# Patient Record
Sex: Female | Born: 1950 | Race: White | Hispanic: No | Marital: Single | Smoking: Never smoker
Health system: Southern US, Community
[De-identification: ages and names within clinical notes are randomized; demographics above are authoritative.]

## PROBLEM LIST (undated history)

## (undated) DIAGNOSIS — I6529 Occlusion and stenosis of unspecified carotid artery: Secondary | ICD-10-CM

## (undated) DIAGNOSIS — E559 Vitamin D deficiency, unspecified: Secondary | ICD-10-CM

## (undated) DIAGNOSIS — M8580 Other specified disorders of bone density and structure, unspecified site: Secondary | ICD-10-CM

## (undated) DIAGNOSIS — Z87442 Personal history of urinary calculi: Secondary | ICD-10-CM

## (undated) HISTORY — DX: Vitamin D deficiency, unspecified: E55.9

## (undated) HISTORY — DX: Personal history of urinary calculi: Z87.442

## (undated) HISTORY — DX: Other specified disorders of bone density and structure, unspecified site: M85.80

## (undated) HISTORY — DX: Occlusion and stenosis of unspecified carotid artery: I65.29

---

## 1973-10-14 HISTORY — PX: SKIN GRAFT: SHX250

## 1999-08-30 ENCOUNTER — Other Ambulatory Visit: Admission: RE | Admit: 1999-08-30 | Discharge: 1999-08-30 | Payer: Self-pay | Admitting: Obstetrics & Gynecology

## 2003-05-06 ENCOUNTER — Other Ambulatory Visit: Admission: RE | Admit: 2003-05-06 | Discharge: 2003-05-06 | Payer: Self-pay | Admitting: Obstetrics & Gynecology

## 2004-05-16 ENCOUNTER — Emergency Department (HOSPITAL_COMMUNITY): Admission: EM | Admit: 2004-05-16 | Discharge: 2004-05-16 | Payer: Self-pay | Admitting: Family Medicine

## 2004-06-20 ENCOUNTER — Other Ambulatory Visit: Admission: RE | Admit: 2004-06-20 | Discharge: 2004-06-20 | Payer: Self-pay | Admitting: Obstetrics & Gynecology

## 2004-10-14 HISTORY — PX: COLONOSCOPY: SHX174

## 2005-07-12 ENCOUNTER — Other Ambulatory Visit: Admission: RE | Admit: 2005-07-12 | Discharge: 2005-07-12 | Payer: Self-pay | Admitting: Obstetrics & Gynecology

## 2005-10-14 DIAGNOSIS — Z87442 Personal history of urinary calculi: Secondary | ICD-10-CM

## 2005-10-14 HISTORY — DX: Personal history of urinary calculi: Z87.442

## 2013-01-11 ENCOUNTER — Encounter: Payer: Self-pay | Admitting: Family Medicine

## 2013-01-11 ENCOUNTER — Ambulatory Visit (INDEPENDENT_AMBULATORY_CARE_PROVIDER_SITE_OTHER): Payer: PRIVATE HEALTH INSURANCE | Admitting: Family Medicine

## 2013-01-11 VITALS — BP 104/59 | HR 82 | Temp 96.8°F | Ht 67.0 in | Wt 108.0 lb

## 2013-01-11 DIAGNOSIS — E559 Vitamin D deficiency, unspecified: Secondary | ICD-10-CM

## 2013-01-11 DIAGNOSIS — M949 Disorder of cartilage, unspecified: Secondary | ICD-10-CM

## 2013-01-11 DIAGNOSIS — M8580 Other specified disorders of bone density and structure, unspecified site: Secondary | ICD-10-CM

## 2013-01-11 NOTE — Progress Notes (Signed)
  Subjective:    Patient ID: Caroline Hodge, female    DOB: 06-23-1951, 62 y.o.   MRN: [id number]  HPI This patient presents for recheck of multiple medical problems. No one accompanies the patient today.  Patient Active Problem List  Diagnosis  . Osteopenia  . Unspecified vitamin D deficiency    In addition ,no specific complaints.  The allergies, current medications, past medical history, surgical history, family and social history are reviewed.  Immunizations reviewed.  Health maintenance reviewed.  The following items are outstanding: All.      Review of Systems  Constitutional: Negative.   HENT: Negative.   Eyes: Negative.   Respiratory: Negative.   Cardiovascular: Negative.   Genitourinary: Negative.   Musculoskeletal: Negative.   Neurological: Negative.   Psychiatric/Behavioral: Negative.        Objective:   Physical Exam BP 104/59  Pulse 82  Temp(Src) 96.8 F (36 C) (Oral)  Ht 5\' 7"  (1.702 m)  Wt 108 lb (48.988 kg)  BMI 16.91 kg/m2  The patient appeared well nourished and normally developed, alert and oriented to time and place. Speech, behavior and judgement appear normal. Vital signs as documented.  Head exam is unremarkable. No scleral icterus or pallor noted.  Neck is without jugular venous distension, thyromegally, or carotid bruits. Carotid upstrokes are brisk bilaterally. No cervical adenopathy. Lungs are clear anteriorly and posteriorly to auscultation. Normal respiratory effort. Cardiac exam reveals regular rate and rhythm. First and second heart sounds normal. No murmurs, rubs or gallops.  Abdominal exam reveals normal bowl sounds, no masses, no organomegaly and no aortic enlargement. No inguinal adenopathy. Extremities are nonedematous and both femoral and pedal pulses are normal. Skin without pallor or jaundice.  Warm and dry, without rash. Neurologic exam reveals normal deep tendon reflexes and normal sensation.          Assessment  & Plan:  1. Osteopenia DEXA due in November of 14  2. Unspecified vitamin D deficiency Cont vit d and calcium  3. .Zostavax needed  4.. RTC clinic 6months

## 2013-01-11 NOTE — Patient Instructions (Signed)
Mammogram needed in May Check on Zostavax

## 2013-03-23 ENCOUNTER — Encounter: Payer: Self-pay | Admitting: Family Medicine

## 2013-07-21 ENCOUNTER — Ambulatory Visit: Payer: PRIVATE HEALTH INSURANCE | Admitting: Family Medicine

## 2013-07-21 ENCOUNTER — Ambulatory Visit (INDEPENDENT_AMBULATORY_CARE_PROVIDER_SITE_OTHER): Payer: Managed Care, Other (non HMO)

## 2013-07-21 DIAGNOSIS — Z23 Encounter for immunization: Secondary | ICD-10-CM

## 2013-07-26 ENCOUNTER — Encounter: Payer: Self-pay | Admitting: Family Medicine

## 2013-07-26 ENCOUNTER — Ambulatory Visit (INDEPENDENT_AMBULATORY_CARE_PROVIDER_SITE_OTHER): Payer: Managed Care, Other (non HMO) | Admitting: Family Medicine

## 2013-07-26 VITALS — BP 108/64 | HR 79 | Temp 98.4°F | Ht 67.0 in | Wt 111.0 lb

## 2013-07-26 DIAGNOSIS — M8580 Other specified disorders of bone density and structure, unspecified site: Secondary | ICD-10-CM

## 2013-07-26 DIAGNOSIS — Z0000 Encounter for general adult medical examination without abnormal findings: Secondary | ICD-10-CM

## 2013-07-26 DIAGNOSIS — N390 Urinary tract infection, site not specified: Secondary | ICD-10-CM

## 2013-07-26 DIAGNOSIS — E559 Vitamin D deficiency, unspecified: Secondary | ICD-10-CM

## 2013-07-26 LAB — POCT UA - MICROSCOPIC ONLY
Bacteria, U Microscopic: NEGATIVE
Casts, Ur, LPF, POC: NEGATIVE
Crystals, Ur, HPF, POC: NEGATIVE
Mucus, UA: NEGATIVE
Yeast, UA: NEGATIVE

## 2013-07-26 LAB — POCT URINALYSIS DIPSTICK
Bilirubin, UA: NEGATIVE
Glucose, UA: NEGATIVE
Nitrite, UA: NEGATIVE
Protein, UA: NEGATIVE
Spec Grav, UA: 1.025
pH, UA: 6

## 2013-07-26 LAB — POCT CBC
Hemoglobin: 13.7 g/dL (ref 12.2–16.2)
MPV: 7.5 fL (ref 0–99.8)
POC Granulocyte: 3.7 (ref 2–6.9)
POC LYMPH PERCENT: 29.4 %L (ref 10–50)
RBC: 4.6 M/uL (ref 4.04–5.48)

## 2013-07-26 MED ORDER — ESTRADIOL 0.5 MG PO TABS: 0.5000 mg | ORAL_TABLET | Freq: Every day | ORAL | Status: DC

## 2013-07-26 MED ORDER — PROGESTERONE MICRONIZED 100 MG PO CAPS: 100.0000 mg | ORAL_CAPSULE | Freq: Every day | ORAL | Status: DC

## 2013-07-26 NOTE — Progress Notes (Signed)
Subjective:    Patient ID: Caroline Hodge, female    DOB: 1951-03-05, 62 y.o.   MRN: [id number]  HPI Pt here for follow up and management of chronic medical problems. Annual wellness visit today. Patient's health maintenance was reviewed and she will get a shingle shot in December and a Prevnar shot next week. She will return the FOBT in December. She will be due for a pelvic exam after mid December or January 2015. Colonoscopy will be due in 2016.     Patient Active Problem List   Diagnosis Date Noted  . Osteopenia 01/11/2013  . Unspecified vitamin D deficiency 01/11/2013   Outpatient Encounter Prescriptions as of 07/26/2013  Medication Sig Dispense Refill  . aspirin 81 MG tablet Take 81 mg by mouth daily.      . Calcium Carbonate-Vitamin D (CALCIUM-VITAMIN D) 500-200 MG-UNIT per tablet Take 1 tablet by mouth 2 (two) times daily with a meal.      . CRANBERRY EXTRACT PO Take 1 tablet by mouth. 3600 mg      . estradiol (ESTRACE) 0.5 MG tablet Take 0.5 mg by mouth daily.      . Omega-3 Fatty Acids (FISH OIL) 1000 MG CAPS Take 1 capsule by mouth.      . progesterone (PROMETRIUM) 100 MG capsule Take 100 mg by mouth daily.      . vitamin C (ASCORBIC ACID) 500 MG tablet Take 500 mg by mouth daily.       No facility-administered encounter medications on file as of 07/26/2013.    Review of Systems  Constitutional: Negative.   HENT: Negative.   Eyes: Negative.   Respiratory: Negative.   Cardiovascular: Negative.   Gastrointestinal: Negative.   Endocrine: Negative.   Genitourinary: Negative.   Musculoskeletal: Negative.   Skin: Negative.   Allergic/Immunologic: Negative.   Neurological: Negative.   Hematological: Negative.   Psychiatric/Behavioral: Negative.        Objective:   Physical Exam  Nursing note and vitals reviewed. Constitutional: She is oriented to person, place, and time. She appears well-developed and well-nourished.  HENT:  Head: Normocephalic and atraumatic.   Right Ear: External ear normal.  Left Ear: External ear normal.  Nose: Nose normal.  Mouth/Throat: Oropharynx is clear and moist. No oropharyngeal exudate.  Eyes: Conjunctivae and EOM are normal. Right eye exhibits no discharge. Left eye exhibits no discharge. No scleral icterus.  Neck: Normal range of motion. Neck supple. No JVD present. No thyromegaly present.  No bruits in the neck  Cardiovascular: Normal rate, regular rhythm, normal heart sounds and intact distal pulses.  Exam reveals no gallop and no friction rub.   No murmur heard. At 72 per minute  Pulmonary/Chest: Effort normal and breath sounds normal. No respiratory distress. She has no wheezes. She has no rales. She exhibits no tenderness.  Abdominal: Soft. Bowel sounds are normal. She exhibits no distension and no mass. There is no tenderness. There is no guarding.  Musculoskeletal: Normal range of motion. She exhibits no edema and no tenderness.  Lymphadenopathy:    She has no cervical adenopathy.  Neurological: She is alert and oriented to person, place, and time. She has normal reflexes. No cranial nerve deficit.  Skin: Skin is warm and dry.  Patient sees dermatologist regularly skin lesion still persists on her back but dermatologist says it is benign.  Psychiatric: She has a normal mood and affect. Her behavior is normal. Judgment and thought content normal.   BP 108/64  Pulse 79  Temp(Src) 98.4 F (36.9 C) (Oral)  Ht 5\' 7"  (1.702 m)  Wt 111 lb (50.349 kg)  BMI 17.38 kg/m2        Assessment & Plan:   1. Annual physical exam   2. Osteopenia   3. Unspecified vitamin D deficiency    Orders Placed This Encounter  Procedures  . DG Bone Density    Standing Status: Future     Number of Occurrences:      Standing Expiration Date: 09/25/2014    Order Specific Question:  Reason for Exam (SYMPTOM  OR DIAGNOSIS REQUIRED)    Answer:  postmenapausal    Order Specific Question:  Is the patient pregnant?    Answer:   No    Order Specific Question:  Preferred imaging location?    Answer:  Internal  . Hepatic function panel  . BMP8+EGFR  . NMR, lipoprofile  . Vit D  25 hydroxy (rtn osteoporosis monitoring)  . POCT CBC  . POCT urinalysis dipstick  . POCT UA - Microscopic Only   Meds ordered this encounter  Medications  . estradiol (ESTRACE) 0.5 MG tablet    Sig: Take 1 tablet (0.5 mg total) by mouth daily.    Dispense:  30 tablet    Refill:  6  . progesterone (PROMETRIUM) 100 MG capsule    Sig: Take 1 capsule (100 mg total) by mouth daily.    Dispense:  30 capsule    Refill:  6   Patient Instructions  Continue current medications. Continue good therapeutic lifestyle changes.  Fall precautions discussed with patient. Follow up as planned and earlier as needed. Health maintenance issues were discussed and planned with patient.     Nyra Capes MD

## 2013-07-26 NOTE — Patient Instructions (Addendum)
Continue current medications. Continue good therapeutic lifestyle changes.  Fall precautions discussed with patient. Follow up as planned and earlier as needed. Health maintenance issues were discussed and planned with patient.

## 2013-07-28 LAB — NMR, LIPOPROFILE
HDL Cholesterol by NMR: 65 mg/dL (ref >=40)
HDL Particle Number: 37.6 umol/L (ref >=30.5)
LDL Size: 20.8 nm (ref >20.5)
Small LDL Particle Number: 395 nmol/L (ref <=527)
Triglycerides by NMR: 83 mg/dL (ref <150)

## 2013-07-28 LAB — BMP8+EGFR
Creatinine, Ser: 0.71 mg/dL (ref 0.57–1.00)
GFR calc Af Amer: 107 mL/min/{1.73_m2} (ref >59)
GFR calc non Af Amer: 93 mL/min/{1.73_m2} (ref >59)
Glucose: 87 mg/dL (ref 65–99)
Potassium: 4.9 mmol/L (ref 3.5–5.2)
Sodium: 143 mmol/L (ref 134–144)

## 2013-07-28 LAB — URINE CULTURE

## 2013-07-28 LAB — HEPATIC FUNCTION PANEL
Bilirubin, Direct: 0.2 mg/dL (ref 0.00–0.40)
Total Bilirubin: 0.9 mg/dL (ref 0.0–1.2)
Total Protein: 6.4 g/dL (ref 6.0–8.5)

## 2013-07-28 LAB — VITAMIN D 25 HYDROXY (VIT D DEFICIENCY, FRACTURES): Vit D, 25-Hydroxy: 22.4 ng/mL — ABNORMAL LOW (ref 30.0–100.0)

## 2013-07-29 ENCOUNTER — Other Ambulatory Visit: Payer: Managed Care, Other (non HMO)

## 2013-07-30 LAB — URINE CULTURE

## 2013-08-10 ENCOUNTER — Ambulatory Visit (INDEPENDENT_AMBULATORY_CARE_PROVIDER_SITE_OTHER): Payer: Managed Care, Other (non HMO) | Admitting: *Deleted

## 2013-08-10 ENCOUNTER — Other Ambulatory Visit: Payer: Self-pay | Admitting: *Deleted

## 2013-08-10 DIAGNOSIS — Z23 Encounter for immunization: Secondary | ICD-10-CM

## 2013-08-10 MED ORDER — VITAMIN D (ERGOCALCIFEROL) 1.25 MG (50000 UNIT) PO CAPS: 50000.0000 [IU] | ORAL_CAPSULE | ORAL | Status: DC

## 2013-08-10 NOTE — Progress Notes (Signed)
Patient tolerated well. VIS given. Patient checked with insurance and they will cover the shot at 100%.

## 2013-08-18 ENCOUNTER — Encounter: Payer: Self-pay | Admitting: Pharmacist

## 2013-08-18 ENCOUNTER — Ambulatory Visit (INDEPENDENT_AMBULATORY_CARE_PROVIDER_SITE_OTHER): Payer: Managed Care, Other (non HMO)

## 2013-08-18 ENCOUNTER — Ambulatory Visit (INDEPENDENT_AMBULATORY_CARE_PROVIDER_SITE_OTHER): Payer: Managed Care, Other (non HMO) | Admitting: Pharmacist

## 2013-08-18 VITALS — Ht 67.0 in | Wt 111.0 lb

## 2013-08-18 DIAGNOSIS — M899 Disorder of bone, unspecified: Secondary | ICD-10-CM

## 2013-08-18 DIAGNOSIS — E559 Vitamin D deficiency, unspecified: Secondary | ICD-10-CM

## 2013-08-18 DIAGNOSIS — M8580 Other specified disorders of bone density and structure, unspecified site: Secondary | ICD-10-CM

## 2013-08-18 NOTE — Patient Instructions (Signed)

## 2013-08-18 NOTE — Progress Notes (Signed)
Patient ID: Caroline Hodge, female   DOB: 1951-01-25, 62 y.o.   MRN: [id number]  Osteoporosis Clinic Current Height: Height: 5\' 7"  (170.2 cm)      Max Lifetime Height: 5\' 7"  Current Weight: Weight: 111 lb (50.349 kg)       Ethnicity:Caucasian    HPI: Does pt already have a diagnosis of:  Osteopenia?  Yes Osteoporosis?  No  Back Pain?  No       Kyphosis?  No Prior fracture?  No Med(s) for Osteoporosis/Osteopenia:  estradiol Med(s) previously tried for Osteoporosis/Osteopenia:  none                                                             PMH: Age at menopause:  62yo Hysterectomy?  No Oophorectomy?  No HRT? Yes - Current.  Type/duration: estradiol Steroid Use?  No Thyroid med?  Yes History of cancer?  No History of digestive disorders (ie Crohn's)?  No Current or previous eating disorders?  No Last Vitamin D Result:  22.4 (10/13/2014_ Last GFR Result:  93 (07/26/2013)   FH/SH: Family history of osteoporosis?  No Parent with history of hip fracture?  No Family history of breast cancer?  No Exercise?  No Smoking?  No Alcohol?  Yes - wine occassioanlly    Calcium Assessment Calcium Intake  # of servings/day  Calcium mg  Milk (8 oz) 1  x  300  = 300mg   Yogurt (4 oz) 0 x  200 = 0  Cheese (1 oz) 1 x  200 = 200mg   Other Calcium sources   250mg   Ca supplement 1 calcium daily = 600mg    Estimated calcium intake per day 1350mg     DEXA Results Date of Test T-Score for AP Spine L1-L4 T-Score for Total Left Hip T-Score for Total Right Hip  08/18/2013 1.1 -1.3 -1.7  08/14/2011 1.3 -1.1 -1.5  07/12/2009 1.3 -1.1 -1.3  03/30/2007 1.3 -1.2 -1.3   FRAX 10 year estimate: Total FX risk:  7.0%  (consider medication if >/= 20%) Hip FX risk:  0.8%  (consider medication if >/= 3%)  Assessment: Osteopenia - stable BMD and low fracture risk based on FRAX estimate Vitamin D Insufficiency - patient increased vitamin D supplement about 2 weeks ago.  Recommendations: 1.  Discussed  DEXA results and fracture risk 2.  continue calcium 1200mg  daily through supplementation or diet. Continue Vitamin D supplementation.  Recheck Vitamin D in 3 months 3.  continue weight bearing exercise - 30 minutes at least 4 days  per week.   4.  Counseled and educated about fall risk and prevention.  Recheck DEXA:  2 years  Time spent counseling patient:  20 minutes  Henrene Pastor, PharmD, CPP

## 2013-10-08 ENCOUNTER — Other Ambulatory Visit: Payer: Self-pay | Admitting: Family Medicine

## 2013-10-08 DIAGNOSIS — R05 Cough: Secondary | ICD-10-CM

## 2013-10-08 DIAGNOSIS — J209 Acute bronchitis, unspecified: Secondary | ICD-10-CM

## 2013-10-08 MED ORDER — HYDROCOD POLST-CHLORPHEN POLST 10-8 MG/5ML PO LQCR: ORAL | Status: DC

## 2013-10-08 MED ORDER — HYDROCOD POLST-CHLORPHEN POLST 10-8 MG/5ML PO LQCR: 5.0000 mL | Freq: Every evening | ORAL | Status: DC | PRN

## 2013-11-24 ENCOUNTER — Ambulatory Visit (INDEPENDENT_AMBULATORY_CARE_PROVIDER_SITE_OTHER): Payer: Managed Care, Other (non HMO) | Admitting: Family Medicine

## 2013-11-24 ENCOUNTER — Other Ambulatory Visit: Payer: PRIVATE HEALTH INSURANCE

## 2013-11-24 DIAGNOSIS — Z1212 Encounter for screening for malignant neoplasm of rectum: Secondary | ICD-10-CM

## 2013-11-24 DIAGNOSIS — Z23 Encounter for immunization: Secondary | ICD-10-CM

## 2013-11-24 NOTE — Addendum Note (Signed)
Addended by: Orma RenderHODGES, Avien Taha F on: 11/24/2013 03:31 PM   Modules accepted: Orders

## 2013-11-24 NOTE — Patient Instructions (Signed)
Herpes Zoster Virus Vaccine What is this medicine? HERPES ZOSTER VIRUS VACCINE (HUR peez ZOS ter vahy ruhs vak SEEN) is a vaccine. It is used to prevent shingles in adults 61 years old and over. This vaccine is not used to treat shingles or nerve pain from shingles. This medicine may be used for other purposes; ask your health care provider or pharmacist if you have questions. COMMON BRAND NAME(S): Zostavax What should I tell my health care provider before I take this medicine? They need to know if you have any of these conditions: -cancer like leukemia or lymphoma -immune system problems or therapy -infection with fever -tuberculosis -an unusual or allergic reaction to vaccines, neomycin, gelatin, other medicines, foods, dyes, or preservatives -pregnant or trying to get pregnant -breast-feeding How should I use this medicine? This vaccine is for injection under the skin. It is given by a health care professional. Talk to your pediatrician regarding the use of this medicine in children. This medicine is not approved for use in children. Overdosage: If you think you have taken too much of this medicine contact a poison control center or emergency room at once. NOTE: This medicine is only for you. Do not share this medicine with others. What if I miss a dose? This does not apply. What may interact with this medicine? Do not take this medicine with any of the following medications: -adalimumab -anakinra -etanercept -infliximab -medicines to treat cancer -medicines that suppress your immune system This medicine may also interact with the following medications: -immunoglobulins -steroid medicines like prednisone or cortisone This list may not describe all possible interactions. Give your health care provider a list of all the medicines, herbs, non-prescription drugs, or dietary supplements you use. Also tell them if you smoke, drink alcohol, or use illegal drugs. Some items may interact  with your medicine. What should I watch for while using this medicine? Visit your doctor for regular check ups. This vaccine, like all vaccines, may not fully protect everyone. After receiving this vaccine it may be possible to pass chickenpox infection to others. Avoid people with immune system problems, pregnant women who have not had chickenpox, and newborns of women who have not had chickenpox. Talk to your doctor for more information. What side effects may I notice from receiving this medicine? Side effects that you should report to your doctor or health care professional as soon as possible: -allergic reactions like skin rash, itching or hives, swelling of the face, lips, or tongue -breathing problems -feeling faint or lightheaded, falls -fever, flu-like symptoms -pain, tingling, numbness in the hands or feet -swelling of the ankles, feet, hands -unusually weak or tired Side effects that usually do not require medical attention (report to your doctor or health care professional if they continue or are bothersome): -aches or pains -chickenpox-like rash -diarrhea -headache -loss of appetite -nausea, vomiting -redness, pain, swelling at site where injected -runny nose This list may not describe all possible side effects. Call your doctor for medical advice about side effects. You may report side effects to FDA at 1-800-FDA-1088. Where should I keep my medicine? This drug is given in a hospital or clinic and will not be stored at home. NOTE: This sheet is a summary. It may not cover all possible information. If you have questions about this medicine, talk to your doctor, pharmacist, or health care provider.  2014, Elsevier/Gold Standard. (2010-03-19 17:43:50)  

## 2013-11-25 LAB — FECAL OCCULT BLOOD, IMMUNOCHEMICAL: Fecal Occult Bld: NEGATIVE

## 2013-12-02 ENCOUNTER — Encounter: Payer: Self-pay | Admitting: *Deleted

## 2013-12-02 NOTE — Progress Notes (Signed)
Quick Note:  Copy of labs sent to patient ______ 

## 2014-02-04 ENCOUNTER — Encounter: Payer: Self-pay | Admitting: *Deleted

## 2014-03-15 ENCOUNTER — Other Ambulatory Visit: Payer: Self-pay | Admitting: Family Medicine

## 2014-03-16 NOTE — Telephone Encounter (Signed)
Last seen 07/26/13  DWM

## 2014-03-24 ENCOUNTER — Other Ambulatory Visit: Payer: Self-pay | Admitting: *Deleted

## 2014-03-24 MED ORDER — PROGESTERONE MICRONIZED 100 MG PO CAPS: 100.0000 mg | ORAL_CAPSULE | Freq: Every day | ORAL | Status: DC

## 2014-03-24 MED ORDER — ESTRADIOL 0.5 MG PO TABS: 0.5000 mg | ORAL_TABLET | Freq: Every day | ORAL | Status: DC

## 2014-04-06 ENCOUNTER — Encounter (HOSPITAL_COMMUNITY): Payer: Self-pay | Admitting: Pharmacy Technician

## 2014-04-07 ENCOUNTER — Other Ambulatory Visit (HOSPITAL_COMMUNITY): Payer: Self-pay

## 2014-04-18 NOTE — Patient Instructions (Signed)
Your procedure is scheduled on: 7/13/015  Report to [facility] at  0630  AM.  Call this number if you have problems the morning of surgery: [phone]   Do not eat food or drink liquids :After Midnight.      Take these medicines the morning of surgery with A SIP OF WATER: none   Do not wear jewelry, make-up or nail polish.  Do not wear lotions, powders, or perfumes.   Do not shave 48 hours prior to surgery.  Do not bring valuables to the hospital.  Contacts, dentures or bridgework may not be worn into surgery.  Leave suitcase in the car. After surgery it may be brought to your room.  For patients admitted to the hospital, checkout time is 11:00 AM the day of discharge.   Patients discharged the day of surgery will not be allowed to drive home.  :     Please read over the following fact sheets that you were given: Coughing and Deep Breathing, Surgical Site Infection Prevention, Anesthesia Post-op Instructions and Care and Recovery After Surgery    Cataract A cataract is a clouding of the lens of the eye. When a lens becomes cloudy, vision is reduced based on the degree and nature of the clouding. Many cataracts reduce vision to some degree. Some cataracts make people more near-sighted as they develop. Other cataracts increase glare. Cataracts that are ignored and become worse can sometimes look white. The white color can be seen through the pupil. CAUSES   Aging. However, cataracts may occur at any age, even in newborns.   Certain drugs.   Trauma to the eye.   Certain diseases such as diabetes.   Specific eye diseases such as chronic inflammation inside the eye or a sudden attack of a rare form of glaucoma.   Inherited or acquired medical problems.  SYMPTOMS   Gradual, progressive drop in vision in the affected eye.   Severe, rapid visual loss. This most often happens when trauma is the cause.  DIAGNOSIS  To detect a cataract, an eye doctor examines the lens. Cataracts are  best diagnosed with an exam of the eyes with the pupils enlarged (dilated) by drops.  TREATMENT  For an early cataract, vision may improve by using different eyeglasses or stronger lighting. If that does not help your vision, surgery is the only effective treatment. A cataract needs to be surgically removed when vision loss interferes with your everyday activities, such as driving, reading, or watching TV. A cataract may also have to be removed if it prevents examination or treatment of another eye problem. Surgery removes the cloudy lens and usually replaces it with a substitute lens (intraocular lens, IOL).  At a time when both you and your doctor agree, the cataract will be surgically removed. If you have cataracts in both eyes, only one is usually removed at a time. This allows the operated eye to heal and be out of danger from any possible problems after surgery (such as infection or poor wound healing). In rare cases, a cataract may be doing damage to your eye. In these cases, your caregiver may advise surgical removal right away. The vast majority of people who have cataract surgery have better vision afterward. HOME CARE INSTRUCTIONS  If you are not planning surgery, you may be asked to do the following:  Use different eyeglasses.   Use stronger or brighter lighting.   Ask your eye doctor about reducing your medicine dose or changing medicines if  it is thought that a medicine caused your cataract. Changing medicines does not make the cataract go away on its own.   Become familiar with your surroundings. Poor vision can lead to injury. Avoid bumping into things on the affected side. You are at a higher risk for tripping or falling.   Exercise extreme care when driving or operating machinery.   Wear sunglasses if you are sensitive to bright light or experiencing problems with glare.  SEEK IMMEDIATE MEDICAL CARE IF:   You have a worsening or sudden vision loss.   You notice redness,  swelling, or increasing pain in the eye.   You have a fever.  Document Released: 09/30/2005 Document Revised: 09/19/2011 Document Reviewed: 05/24/2011 [facility] Patient Information 2012 [place].PATIENT INSTRUCTIONS POST-ANESTHESIA  IMMEDIATELY FOLLOWING SURGERY:  Do not drive or operate machinery for the first twenty four hours after surgery.  Do not make any important decisions for twenty four hours after surgery or while taking narcotic pain medications or sedatives.  If you develop intractable nausea and vomiting or a severe headache please notify your doctor immediately.  FOLLOW-UP:  Please make an appointment with your surgeon as instructed. You do not need to follow up with anesthesia unless specifically instructed to do so.  WOUND CARE INSTRUCTIONS (if applicable):  Keep a dry clean dressing on the anesthesia/puncture wound site if there is drainage.  Once the wound has quit draining you may leave it open to air.  Generally you should leave the bandage intact for twenty four hours unless there is drainage.  If the epidural site drains for more than 36-48 hours please call the anesthesia department.  QUESTIONS?:  Please feel free to call your physician or the hospital operator if you have any questions, and they will be happy to assist you.

## 2014-04-19 ENCOUNTER — Encounter (HOSPITAL_COMMUNITY)
Admission: RE | Admit: 2014-04-19 | Discharge: 2014-04-19 | Disposition: A | Discharge status: Home / Self Care | Payer: PRIVATE HEALTH INSURANCE | Source: Ambulatory Visit | Attending: Ophthalmology | Admitting: Ophthalmology

## 2014-04-19 ENCOUNTER — Encounter (HOSPITAL_COMMUNITY): Payer: Self-pay

## 2014-04-19 ENCOUNTER — Other Ambulatory Visit: Payer: Self-pay

## 2014-04-19 DIAGNOSIS — Z01810 Encounter for preprocedural cardiovascular examination: Secondary | ICD-10-CM | POA: Insufficient documentation

## 2014-04-19 DIAGNOSIS — Z01812 Encounter for preprocedural laboratory examination: Secondary | ICD-10-CM | POA: Insufficient documentation

## 2014-04-19 LAB — BASIC METABOLIC PANEL
Anion gap: 9 (ref 5–15)
BUN: 17 mg/dL (ref 6–23)
CHLORIDE: 106 meq/L (ref 96–112)
CO2: 28 meq/L (ref 19–32)
Calcium: 9.1 mg/dL (ref 8.4–10.5)
Creatinine, Ser: 0.71 mg/dL (ref 0.50–1.10)
GFR calc non Af Amer: >90 mL/min (ref >90)
Glucose, Bld: 90 mg/dL (ref 70–99)
Potassium: 4.9 mEq/L (ref 3.7–5.3)
SODIUM: 143 meq/L (ref 137–147)

## 2014-04-19 LAB — HEMOGLOBIN AND HEMATOCRIT, BLOOD
HCT: 41.4 % (ref 36.0–46.0)
HEMOGLOBIN: 13.6 g/dL (ref 12.0–15.0)

## 2014-04-22 MED ORDER — CYCLOPENTOLATE-PHENYLEPHRINE OP SOLN OPTIME - NO CHARGE
OPHTHALMIC | Status: AC
  Filled 2014-04-22: qty 2

## 2014-04-22 MED ORDER — LIDOCAINE HCL 3.5 % OP GEL
OPHTHALMIC | Status: AC
  Filled 2014-04-22: qty 1

## 2014-04-22 MED ORDER — TETRACAINE HCL 0.5 % OP SOLN
OPHTHALMIC | Status: AC
  Filled 2014-04-22: qty 2

## 2014-04-22 MED ORDER — LIDOCAINE HCL (PF) 1 % IJ SOLN
INTRAMUSCULAR | Status: AC
  Filled 2014-04-22: qty 2

## 2014-04-22 MED ORDER — PHENYLEPHRINE HCL 2.5 % OP SOLN
OPHTHALMIC | Status: AC
Start: 2014-04-22 — End: 2014-04-22
  Filled 2014-04-22: qty 15

## 2014-04-22 MED ORDER — NEOMYCIN-POLYMYXIN-DEXAMETH 3.5-10000-0.1 OP SUSP
OPHTHALMIC | Status: AC
  Filled 2014-04-22: qty 5

## 2014-04-25 ENCOUNTER — Ambulatory Visit (HOSPITAL_COMMUNITY)
Admission: RE | Admit: 2014-04-25 | Discharge: 2014-04-25 | Disposition: A | Discharge status: Home / Self Care | Payer: PRIVATE HEALTH INSURANCE | Source: Ambulatory Visit | Attending: Ophthalmology | Admitting: Ophthalmology

## 2014-04-25 ENCOUNTER — Encounter (HOSPITAL_COMMUNITY): Payer: Self-pay | Admitting: *Deleted

## 2014-04-25 ENCOUNTER — Encounter (HOSPITAL_COMMUNITY)
Admission: RE | Disposition: A | Discharge status: Home / Self Care | Payer: Self-pay | Source: Ambulatory Visit | Attending: Ophthalmology

## 2014-04-25 ENCOUNTER — Ambulatory Visit (HOSPITAL_COMMUNITY): Payer: PRIVATE HEALTH INSURANCE | Admitting: Anesthesiology

## 2014-04-25 ENCOUNTER — Encounter (HOSPITAL_COMMUNITY): Payer: PRIVATE HEALTH INSURANCE | Admitting: Anesthesiology

## 2014-04-25 DIAGNOSIS — H2510 Age-related nuclear cataract, unspecified eye: Secondary | ICD-10-CM | POA: Insufficient documentation

## 2014-04-25 HISTORY — PX: CATARACT EXTRACTION W/PHACO: SHX586

## 2014-04-25 SURGERY — PHACOEMULSIFICATION, CATARACT, WITH IOL INSERTION
Anesthesia: Monitor Anesthesia Care | Site: Eye | Laterality: Right

## 2014-04-25 MED ORDER — LIDOCAINE HCL 3.5 % OP GEL
1.0000 "application " | Freq: Once | OPHTHALMIC | Status: AC
  Administered 2014-04-25: 1 via OPHTHALMIC

## 2014-04-25 MED ORDER — LACTATED RINGERS IV SOLN
INTRAVENOUS | Status: DC
  Administered 2014-04-25: 08:00:00 via INTRAVENOUS

## 2014-04-25 MED ORDER — MIDAZOLAM HCL 2 MG/2ML IJ SOLN
INTRAMUSCULAR | Status: AC
  Filled 2014-04-25: qty 2

## 2014-04-25 MED ORDER — BSS IO SOLN
INTRAOCULAR | Status: DC | PRN
  Administered 2014-04-25: 15 mL via INTRAOCULAR

## 2014-04-25 MED ORDER — PROVISC 10 MG/ML IO SOLN
INTRAOCULAR | Status: DC | PRN
  Administered 2014-04-25: 0.85 mL via INTRAOCULAR

## 2014-04-25 MED ORDER — FENTANYL CITRATE 0.05 MG/ML IJ SOLN
25.0000 ug | INTRAMUSCULAR | Status: AC
  Administered 2014-04-25 (×2): 25 ug via INTRAVENOUS

## 2014-04-25 MED ORDER — EPINEPHRINE HCL 1 MG/ML IJ SOLN
INTRAMUSCULAR | Status: AC
  Filled 2014-04-25: qty 1

## 2014-04-25 MED ORDER — FENTANYL CITRATE 0.05 MG/ML IJ SOLN
INTRAMUSCULAR | Status: AC
  Filled 2014-04-25: qty 2

## 2014-04-25 MED ORDER — LIDOCAINE 3.5 % OP GEL OPTIME - NO CHARGE
OPHTHALMIC | Status: DC | PRN
  Administered 2014-04-25: 1 [drp] via OPHTHALMIC

## 2014-04-25 MED ORDER — EPINEPHRINE HCL 1 MG/ML IJ SOLN
INTRAOCULAR | Status: DC | PRN
  Administered 2014-04-25: 08:00:00

## 2014-04-25 MED ORDER — CYCLOPENTOLATE-PHENYLEPHRINE 0.2-1 % OP SOLN
1.0000 [drp] | OPHTHALMIC | Status: AC
  Administered 2014-04-25 (×3): 1 [drp] via OPHTHALMIC

## 2014-04-25 MED ORDER — TETRACAINE HCL 0.5 % OP SOLN
1.0000 [drp] | OPHTHALMIC | Status: AC
  Administered 2014-04-25 (×3): 1 [drp] via OPHTHALMIC

## 2014-04-25 MED ORDER — LIDOCAINE HCL (PF) 1 % IJ SOLN
INTRAMUSCULAR | Status: DC | PRN
  Administered 2014-04-25: .4 mL

## 2014-04-25 MED ORDER — NEOMYCIN-POLYMYXIN-DEXAMETH 3.5-10000-0.1 OP SUSP
OPHTHALMIC | Status: DC | PRN
  Administered 2014-04-25: 2 [drp] via OPHTHALMIC

## 2014-04-25 MED ORDER — PHENYLEPHRINE HCL 2.5 % OP SOLN
1.0000 [drp] | OPHTHALMIC | Status: AC
  Administered 2014-04-25 (×3): 1 [drp] via OPHTHALMIC

## 2014-04-25 MED ORDER — POVIDONE-IODINE 5 % OP SOLN
OPHTHALMIC | Status: DC | PRN
  Administered 2014-04-25: 1 via OPHTHALMIC

## 2014-04-25 MED ORDER — MIDAZOLAM HCL 2 MG/2ML IJ SOLN
1.0000 mg | INTRAMUSCULAR | Status: DC | PRN
  Administered 2014-04-25: 2 mg via INTRAVENOUS

## 2014-04-25 SURGICAL SUPPLY — 33 items
CAPSULAR TENSION RING-AMO (OPHTHALMIC RELATED) IMPLANT
CLOTH BEACON ORANGE TIMEOUT ST (SAFETY) ×3 IMPLANT
EYE SHIELD UNIVERSAL CLEAR (GAUZE/BANDAGES/DRESSINGS) ×3 IMPLANT
GLOVE BIO SURGEON STRL SZ 6.5 (GLOVE) IMPLANT
GLOVE BIO SURGEONS STRL SZ 6.5 (GLOVE)
GLOVE BIOGEL PI IND STRL 6.5 (GLOVE) IMPLANT
GLOVE BIOGEL PI IND STRL 7.0 (GLOVE) ×1 IMPLANT
GLOVE BIOGEL PI IND STRL 7.5 (GLOVE) IMPLANT
GLOVE BIOGEL PI INDICATOR 6.5 (GLOVE)
GLOVE BIOGEL PI INDICATOR 7.0 (GLOVE) ×2
GLOVE BIOGEL PI INDICATOR 7.5 (GLOVE)
GLOVE ECLIPSE 6.5 STRL STRAW (GLOVE) IMPLANT
GLOVE ECLIPSE 7.0 STRL STRAW (GLOVE) IMPLANT
GLOVE ECLIPSE 7.5 STRL STRAW (GLOVE) IMPLANT
GLOVE EXAM NITRILE LRG STRL (GLOVE) IMPLANT
GLOVE EXAM NITRILE MD LF STRL (GLOVE) IMPLANT
GLOVE SKINSENSE NS SZ6.5 (GLOVE)
GLOVE SKINSENSE NS SZ7.0 (GLOVE) ×2
GLOVE SKINSENSE STRL SZ6.5 (GLOVE) IMPLANT
GLOVE SKINSENSE STRL SZ7.0 (GLOVE) ×1 IMPLANT
KIT VITRECTOMY (OPHTHALMIC RELATED) IMPLANT
PAD ARMBOARD 7.5X6 YLW CONV (MISCELLANEOUS) ×3 IMPLANT
PROC W NO LENS (INTRAOCULAR LENS)
PROC W SPEC LENS (INTRAOCULAR LENS)
PROCESS W NO LENS (INTRAOCULAR LENS) IMPLANT
PROCESS W SPEC LENS (INTRAOCULAR LENS) IMPLANT
RING MALYGIN (MISCELLANEOUS) IMPLANT
SIGHTPATH CAT PROC W REG LENS (Ophthalmic Related) ×3 IMPLANT
SYR TB 1ML LL NO SAFETY (SYRINGE) ×3 IMPLANT
TAPE SURG TRANSPORE 1 IN (GAUZE/BANDAGES/DRESSINGS) ×1 IMPLANT
TAPE SURGICAL TRANSPORE 1 IN (GAUZE/BANDAGES/DRESSINGS) ×2
VISCOELASTIC ADDITIONAL (OPHTHALMIC RELATED) IMPLANT
WATER STERILE IRR 250ML POUR (IV SOLUTION) ×3 IMPLANT

## 2014-04-25 NOTE — Anesthesia Preprocedure Evaluation (Addendum)
Anesthesia Evaluation  Patient identified by MRN, date of birth, ID band Patient awake    Reviewed: Allergy & Precautions, H&P , NPO status , Patient's Chart, lab work & pertinent test results  Airway Mallampati: I TM Distance: >3 FB Neck ROM: Full    Dental  (+) Teeth Intact   Pulmonary neg pulmonary ROS,  breath sounds clear to auscultation        Cardiovascular negative cardio ROS  Rhythm:Regular Rate:Normal     Neuro/Psych    GI/Hepatic negative GI ROS,   Endo/Other    Renal/GU      Musculoskeletal   Abdominal   Peds  Hematology   Anesthesia Other Findings   Reproductive/Obstetrics                           Anesthesia Physical Anesthesia Plan  ASA: I  Anesthesia Plan: MAC   Post-op Pain Management:    Induction: Intravenous  Airway Management Planned: Nasal Cannula  Additional Equipment:   Intra-op Plan:   Post-operative Plan:   Informed Consent: I have reviewed the patients History and Physical, chart, labs and discussed the procedure including the risks, benefits and alternatives for the proposed anesthesia with the patient or authorized representative who has indicated his/her understanding and acceptance.     Plan Discussed with:   Anesthesia Plan Comments:         Anesthesia Quick Evaluation  

## 2014-04-25 NOTE — Discharge Instructions (Signed)

## 2014-04-25 NOTE — Transfer of Care (Signed)
Immediate Anesthesia Transfer of Care Note  Patient: Caroline MoralesDebra Dollins  Procedure(s) Performed: Procedure(s) with comments: CATARACT EXTRACTION PHACO AND INTRAOCULAR LENS PLACEMENT (IOC) (Right) - CDE 8.46  Patient Location: Short Stay  Anesthesia Type:MAC  Level of Consciousness: awake  Airway & Oxygen Therapy: Patient Spontanous Breathing  Post-op Assessment: Report given to PACU RN  Post vital signs: Reviewed  Complications: No apparent anesthesia complications

## 2014-04-25 NOTE — H&P (Signed)
I have reviewed the H&P, the patient was re-examined, and I have identified no interval changes in medical condition and plan of care since the history and physical of record  

## 2014-04-25 NOTE — Anesthesia Postprocedure Evaluation (Signed)
  Anesthesia Post-op Note  Patient: Caroline Hodge  Procedure(s) Performed: Procedure(s) with comments: CATARACT EXTRACTION PHACO AND INTRAOCULAR LENS PLACEMENT (IOC) (Right) - CDE 8.46  Patient Location: Short Stay  Anesthesia Type:MAC  Level of Consciousness: awake  Airway and Oxygen Therapy: Patient Spontanous Breathing  Post-op Pain: none  Post-op Assessment: Post-op Vital signs reviewed, Patient's Cardiovascular Status Stable, Respiratory Function Stable, Patent Airway and No signs of Nausea or vomiting  Post-op Vital Signs: Reviewed and stable  Last Vitals:  Filed Vitals:   04/25/14 0755  BP: 128/60  Resp: 0    Complications: No apparent anesthesia complications

## 2014-04-25 NOTE — Op Note (Signed)
Date of Admission: 04/25/2014  Date of Surgery: 04/25/2014   Pre-Op Dx: Cataract Right Eye  Post-Op Dx: Nuclear Cataract Right  Eye,  Dx Code 366.16  Surgeon: Gemma PayorKerry Lisha Vitale, M.D.  Assistants: None  Anesthesia: Topical with MAC  Indications: Painless, progressive loss of vision with compromise of daily activities.  Surgery: Cataract Extraction with Intraocular lens Implant Right Eye  Discription: The patient had dilating drops and viscous lidocaine placed into the Right eye in the pre-op holding area. After transfer to the operating room, a time out was performed. The patient was then prepped and draped. Beginning with a 75 degree blade a paracentesis port was made at the surgeon's 2 o'clock position. The anterior chamber was then filled with 1% non-preserved lidocaine. This was followed by filling the anterior chamber with Provisc.  A 2.174mm keratome blade was used to make a clear corneal incision at the temporal limbus.  A bent cystatome needle was used to create a continuous tear capsulotomy. Hydrodissection was performed with balanced salt solution on a Fine canula. The lens nucleus was then removed using the phacoemulsification handpiece. Residual cortex was removed with the I&A handpiece. The anterior chamber and capsular bag were refilled with Provisc. A posterior chamber intraocular lens was placed into the capsular bag with it's injector. The implant was positioned with the Kuglan hook. The Provisc was then removed from the anterior chamber and capsular bag with the I&A handpiece. Stromal hydration of the main incision and paracentesis port was performed with BSS on a Fine canula. The wounds were tested for leak which was negative. The patient tolerated the procedure well. There were no operative complications. The patient was then transferred to the recovery room in stable condition.  Complications: None  Specimen: None  EBL: None  Prosthetic device: Alcon AcrySof ReStor, power 18.0 D,  SN [id number].

## 2014-04-26 ENCOUNTER — Encounter (HOSPITAL_COMMUNITY): Payer: Self-pay | Admitting: Ophthalmology

## 2014-05-02 ENCOUNTER — Encounter (HOSPITAL_COMMUNITY): Payer: Self-pay | Admitting: Pharmacy Technician

## 2014-05-04 MED ORDER — FENTANYL CITRATE 0.05 MG/ML IJ SOLN: 25.0000 ug | INTRAMUSCULAR | Status: DC | PRN

## 2014-05-04 MED ORDER — ONDANSETRON HCL 4 MG/2ML IJ SOLN: 4.0000 mg | Freq: Once | INTRAMUSCULAR | Status: AC | PRN

## 2014-05-05 ENCOUNTER — Encounter (HOSPITAL_COMMUNITY)
Admission: RE | Admit: 2014-05-05 | Discharge: 2014-05-05 | Disposition: A | Discharge status: Home / Self Care | Payer: PRIVATE HEALTH INSURANCE | Source: Ambulatory Visit | Attending: Ophthalmology | Admitting: Ophthalmology

## 2014-05-06 MED ORDER — LIDOCAINE HCL (PF) 1 % IJ SOLN
INTRAMUSCULAR | Status: AC
  Filled 2014-05-06: qty 2

## 2014-05-06 MED ORDER — LIDOCAINE HCL 3.5 % OP GEL
OPHTHALMIC | Status: AC
  Filled 2014-05-06: qty 1

## 2014-05-06 MED ORDER — NEOMYCIN-POLYMYXIN-DEXAMETH 3.5-10000-0.1 OP SUSP
OPHTHALMIC | Status: AC
  Filled 2014-05-06: qty 5

## 2014-05-06 MED ORDER — TETRACAINE HCL 0.5 % OP SOLN
OPHTHALMIC | Status: AC
  Filled 2014-05-06: qty 2

## 2014-05-06 MED ORDER — CYCLOPENTOLATE-PHENYLEPHRINE OP SOLN OPTIME - NO CHARGE
OPHTHALMIC | Status: AC
  Filled 2014-05-06: qty 2

## 2014-05-06 MED ORDER — PHENYLEPHRINE HCL 2.5 % OP SOLN
OPHTHALMIC | Status: AC
  Filled 2014-05-06: qty 15

## 2014-05-09 ENCOUNTER — Encounter (HOSPITAL_COMMUNITY)
Admission: RE | Disposition: A | Discharge status: Home / Self Care | Payer: Self-pay | Source: Ambulatory Visit | Attending: Ophthalmology

## 2014-05-09 ENCOUNTER — Encounter (HOSPITAL_COMMUNITY): Payer: Self-pay | Admitting: *Deleted

## 2014-05-09 ENCOUNTER — Encounter (HOSPITAL_COMMUNITY): Payer: PRIVATE HEALTH INSURANCE | Admitting: Anesthesiology

## 2014-05-09 ENCOUNTER — Ambulatory Visit (HOSPITAL_COMMUNITY)
Admission: RE | Admit: 2014-05-09 | Discharge: 2014-05-09 | Disposition: A | Discharge status: Home / Self Care | Payer: PRIVATE HEALTH INSURANCE | Source: Ambulatory Visit | Attending: Ophthalmology | Admitting: Ophthalmology

## 2014-05-09 ENCOUNTER — Ambulatory Visit (HOSPITAL_COMMUNITY): Payer: PRIVATE HEALTH INSURANCE | Admitting: Anesthesiology

## 2014-05-09 DIAGNOSIS — H2510 Age-related nuclear cataract, unspecified eye: Secondary | ICD-10-CM | POA: Insufficient documentation

## 2014-05-09 HISTORY — PX: CATARACT EXTRACTION W/PHACO: SHX586

## 2014-05-09 SURGERY — PHACOEMULSIFICATION, CATARACT, WITH IOL INSERTION
Anesthesia: Monitor Anesthesia Care | Site: Eye | Laterality: Left

## 2014-05-09 MED ORDER — MIDAZOLAM HCL 2 MG/2ML IJ SOLN
INTRAMUSCULAR | Status: AC
  Filled 2014-05-09: qty 2

## 2014-05-09 MED ORDER — LIDOCAINE HCL 3.5 % OP GEL
1.0000 "application " | Freq: Once | OPHTHALMIC | Status: AC
  Administered 2014-05-09: 1 via OPHTHALMIC

## 2014-05-09 MED ORDER — LACTATED RINGERS IV SOLN
INTRAVENOUS | Status: DC
Start: 2014-05-09 — End: 2014-05-09
  Administered 2014-05-09: 13:00:00 via INTRAVENOUS

## 2014-05-09 MED ORDER — MIDAZOLAM HCL 2 MG/2ML IJ SOLN
1.0000 mg | INTRAMUSCULAR | Status: DC | PRN
  Administered 2014-05-09: 2 mg via INTRAVENOUS
  Administered 2014-05-09: 1 mg via INTRAVENOUS

## 2014-05-09 MED ORDER — MIDAZOLAM HCL 5 MG/5ML IJ SOLN
INTRAMUSCULAR | Status: AC
  Filled 2014-05-09: qty 5

## 2014-05-09 MED ORDER — POVIDONE-IODINE 5 % OP SOLN
OPHTHALMIC | Status: DC | PRN
  Administered 2014-05-09: 1 via OPHTHALMIC

## 2014-05-09 MED ORDER — MIDAZOLAM HCL 5 MG/5ML IJ SOLN
INTRAMUSCULAR | Status: DC | PRN
  Administered 2014-05-09: 1 mg via INTRAVENOUS

## 2014-05-09 MED ORDER — BSS IO SOLN
INTRAOCULAR | Status: DC | PRN
  Administered 2014-05-09: 15 mL

## 2014-05-09 MED ORDER — PHENYLEPHRINE HCL 2.5 % OP SOLN
1.0000 [drp] | OPHTHALMIC | Status: AC
  Administered 2014-05-09 (×3): 1 [drp] via OPHTHALMIC

## 2014-05-09 MED ORDER — NEOMYCIN-POLYMYXIN-DEXAMETH 3.5-10000-0.1 OP SUSP
OPHTHALMIC | Status: DC | PRN
  Administered 2014-05-09: 1 [drp] via OPHTHALMIC

## 2014-05-09 MED ORDER — LIDOCAINE HCL (PF) 1 % IJ SOLN
INTRAMUSCULAR | Status: DC | PRN
  Administered 2014-05-09: .4 mL

## 2014-05-09 MED ORDER — EPINEPHRINE HCL 1 MG/ML IJ SOLN
INTRAMUSCULAR | Status: AC
  Filled 2014-05-09: qty 1

## 2014-05-09 MED ORDER — PROVISC 10 MG/ML IO SOLN
INTRAOCULAR | Status: DC | PRN
  Administered 2014-05-09: 0.85 mL via INTRAOCULAR

## 2014-05-09 MED ORDER — CYCLOPENTOLATE-PHENYLEPHRINE 0.2-1 % OP SOLN
1.0000 [drp] | OPHTHALMIC | Status: AC
  Administered 2014-05-09 (×3): 1 [drp] via OPHTHALMIC

## 2014-05-09 MED ORDER — FENTANYL CITRATE 0.05 MG/ML IJ SOLN
INTRAMUSCULAR | Status: AC
  Filled 2014-05-09: qty 2

## 2014-05-09 MED ORDER — TETRACAINE HCL 0.5 % OP SOLN
1.0000 [drp] | OPHTHALMIC | Status: AC
  Administered 2014-05-09 (×3): 1 [drp] via OPHTHALMIC

## 2014-05-09 MED ORDER — FENTANYL CITRATE 0.05 MG/ML IJ SOLN
25.0000 ug | INTRAMUSCULAR | Status: AC
  Administered 2014-05-09 (×2): 25 ug via INTRAVENOUS

## 2014-05-09 MED ORDER — LIDOCAINE 3.5 % OP GEL OPTIME - NO CHARGE
OPHTHALMIC | Status: DC | PRN
Start: 2014-05-09 — End: 2014-05-09
  Administered 2014-05-09: 1 [drp] via OPHTHALMIC

## 2014-05-09 MED ORDER — EPINEPHRINE HCL 1 MG/ML IJ SOLN
INTRAMUSCULAR | Status: DC | PRN
  Administered 2014-05-09: 14:00:00

## 2014-05-09 SURGICAL SUPPLY — 13 items
CLOTH BEACON ORANGE TIMEOUT ST (SAFETY) ×3 IMPLANT
EYE SHIELD UNIVERSAL CLEAR (GAUZE/BANDAGES/DRESSINGS) ×3 IMPLANT
GLOVE BIOGEL PI IND STRL 7.0 (GLOVE) ×1 IMPLANT
GLOVE BIOGEL PI INDICATOR 7.0 (GLOVE) ×2
GLOVE EXAM NITRILE MD LF STRL (GLOVE) ×3 IMPLANT
LENS INTRAOCULAR RESTOR ×3 IMPLANT
PAD ARMBOARD 7.5X6 YLW CONV (MISCELLANEOUS) ×3 IMPLANT
PROC W SPEC LENS (INTRAOCULAR LENS) ×3
PROCESS W SPEC LENS (INTRAOCULAR LENS) ×1 IMPLANT
SYRINGE LUER LOK 1CC (MISCELLANEOUS) ×3 IMPLANT
TAPE SURG TRANSPORE 1 IN (GAUZE/BANDAGES/DRESSINGS) ×1 IMPLANT
TAPE SURGICAL TRANSPORE 1 IN (GAUZE/BANDAGES/DRESSINGS) ×2
WATER STERILE IRR 250ML POUR (IV SOLUTION) ×3 IMPLANT

## 2014-05-09 NOTE — Anesthesia Postprocedure Evaluation (Signed)
  Anesthesia Post-op Note  Patient: Caroline Hodge  Procedure(s) Performed: Procedure(s): CATARACT EXTRACTION PHACO AND INTRAOCULAR LENS PLACEMENT LEFT EYE CDE=6.23 (Left)  Patient Location: Short Stay  Anesthesia Type:MAC  Level of Consciousness: awake, alert , oriented and patient cooperative  Airway and Oxygen Therapy: Patient Spontanous Breathing  Post-op Pain: none  Post-op Assessment: Post-op Vital signs reviewed, Patient's Cardiovascular Status Stable, Respiratory Function Stable, Patent Airway and Pain level controlled  Post-op Vital Signs: Reviewed and stable  Last Vitals:  Filed Vitals:   05/09/14 1340  BP: 99/65  Pulse:   Temp:   Resp: 26    Complications: No apparent anesthesia complications

## 2014-05-09 NOTE — Anesthesia Preprocedure Evaluation (Signed)
Anesthesia Evaluation  Patient identified by MRN, date of birth, ID band Patient awake    Reviewed: Allergy & Precautions, H&P , NPO status , Patient's Chart, lab work & pertinent test results  Airway Mallampati: I TM Distance: >3 FB Neck ROM: Full    Dental  (+) Teeth Intact   Pulmonary neg pulmonary ROS,  breath sounds clear to auscultation        Cardiovascular negative cardio ROS  Rhythm:Regular Rate:Normal     Neuro/Psych    GI/Hepatic negative GI ROS,   Endo/Other    Renal/GU      Musculoskeletal   Abdominal   Peds  Hematology   Anesthesia Other Findings   Reproductive/Obstetrics                           Anesthesia Physical Anesthesia Plan  ASA: I  Anesthesia Plan: MAC   Post-op Pain Management:    Induction: Intravenous  Airway Management Planned: Nasal Cannula  Additional Equipment:   Intra-op Plan:   Post-operative Plan:   Informed Consent: I have reviewed the patients History and Physical, chart, labs and discussed the procedure including the risks, benefits and alternatives for the proposed anesthesia with the patient or authorized representative who has indicated his/her understanding and acceptance.     Plan Discussed with:   Anesthesia Plan Comments:         Anesthesia Quick Evaluation  

## 2014-05-09 NOTE — Op Note (Signed)
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  Assistants: None  Anesthesia: Topical with MAC  Indications: Painless, progressive loss of vision with compromise of daily activities.  Surgery: Cataract Extraction with Intraocular lens Implant Left Eye  Discription: The patient had dilating drops and viscous lidocaine placed into the Left eye in the pre-op holding area. After transfer to the operating room, a time out was performed. The patient was then prepped and draped. Beginning with a 75 degree blade a paracentesis port was made at the surgeon's 2 o'clock position. The anterior chamber was then filled with 1% non-preserved lidocaine. This was followed by filling the anterior chamber with Provisc.  A 2.144mm keratome blade was used to make a clear corneal incision at the temporal limbus.  A bent cystatome needle was used to create a continuous tear capsulotomy. Hydrodissection was performed with balanced salt solution on a Fine canula. The lens nucleus was then removed using the phacoemulsification handpiece. Residual cortex was removed with the I&A handpiece. The anterior chamber and capsular bag were refilled with Provisc. A posterior chamber intraocular lens was placed into the capsular bag with it's injector. The implant was positioned with the Kuglan hook. The Provisc was then removed from the anterior chamber and capsular bag with the I&A handpiece. Stromal hydration of the main incision and paracentesis port was performed with BSS on a Fine canula. The wounds were tested for leak which was negative. The patient tolerated the procedure well. There were no operative complications. The patient was then transferred to the recovery room in stable condition.  Complications: None  Specimen: None  EBL: None  Prosthetic device: Alcon AcrySof [id number], power 18.5  D, SN [id number].

## 2014-05-09 NOTE — Transfer of Care (Signed)
Immediate Anesthesia Transfer of Care Note  Patient: Caroline MoralesDebra Hodge  Procedure(s) Performed: Procedure(s): CATARACT EXTRACTION PHACO AND INTRAOCULAR LENS PLACEMENT LEFT EYE CDE=6.23 (Left)  Patient Location: Short Stay  Anesthesia Type:MAC  Level of Consciousness: awake, alert , oriented and patient cooperative  Airway & Oxygen Therapy: Patient Spontanous Breathing  Post-op Assessment: Report given to PACU RN and Post -op Vital signs reviewed and stable  Post vital signs: Reviewed and stable  Complications: No apparent anesthesia complications

## 2014-05-09 NOTE — H&P (Signed)
I have reviewed the H&P, the patient was re-examined, and I have identified no interval changes in medical condition and plan of care since the history and physical of record  

## 2014-05-09 NOTE — Discharge Instructions (Signed)
Cataract Surgery °Care After °Refer to this sheet in the next few weeks. These instructions provide you with information on caring for yourself after your procedure. Your caregiver may also give you more specific instructions. Your treatment has been planned according to current medical practices, but problems sometimes occur. Call your caregiver if you have any problems or questions after your procedure.  °HOME CARE INSTRUCTIONS  °· Avoid strenuous activities as directed by your caregiver. °· Ask your caregiver when you can resume driving. °· Use eyedrops or other medicines to help healing and control pressure inside your eye as directed by your caregiver. °· Only take over-the-counter or prescription medicines for pain, discomfort, or fever as directed by your caregiver. °· Do not to touch or rub your eyes. °· You may be instructed to use a protective shield during the first few days and nights after surgery. If not, wear sunglasses to protect your eyes. This is to protect the eye from pressure or from being accidentally bumped. °· Keep the area around your eye clean and dry. Avoid swimming or allowing water to hit you directly in the face while showering. Keep soap and shampoo out of your eyes. °· Do not bend or lift heavy objects. Bending increases pressure in the eye. You can walk, climb stairs, and do light household chores. °· Do not put a contact lens into the eye that had surgery until your caregiver says it is okay to do so. °· Ask your doctor when you can return to work. This will depend on the kind of work that you do. If you work in a dusty environment, you may be advised to wear protective eyewear for a period of time. °· Ask your caregiver when it will be safe to engage in sexual activity. °· Continue with your regular eye exams as directed by your caregiver. °What to expect: °· It is normal to feel itching and mild discomfort for a few days after cataract surgery. Some fluid discharge is also common,  and your eye may be sensitive to light and touch. °· After 1 to 2 days, even moderate discomfort should disappear. In most cases, healing will take about 6 weeks. °· If you received an intraocular lens (IOL), you may notice that colors are very bright or have a blue tinge. Also, if you have been in bright sunlight, everything may appear reddish for a few hours. If you see these color tinges, it is because your lens is clear and no longer cloudy. Within a few months after receiving an IOL, these extra colors should go away. When you have healed, you will probably need new glasses. °SEEK MEDICAL CARE IF:  °· You have increased bruising around your eye. °· You have discomfort not helped by medicine. °SEEK IMMEDIATE MEDICAL CARE IF:  °· You have a  fever. °· You have a worsening or sudden vision loss. °· You have redness, swelling, or increasing pain in the eye. °· You have a thick discharge from the eye that had surgery. °MAKE SURE YOU: °· Understand these instructions. °· Will watch your condition. °· Will get help right away if you are not doing well or get worse. °Document Released: 04/19/2005 Document Revised: 12/23/2011 Document Reviewed: 05/24/2011 °ExitCare® Patient Information ©2015 [employer]. This information is not intended to replace advice given to you by your health care provider. Make sure you discuss any questions you have with your health care provider. ° ° ° °PATIENT INSTRUCTIONS °POST-ANESTHESIA ° °IMMEDIATELY FOLLOWING SURGERY:  Do not   drive or operate machinery for the first twenty four hours after surgery.  Do not make any important decisions for twenty four hours after surgery or while taking narcotic pain medications or sedatives.  If you develop intractable nausea and vomiting or a severe headache please notify your doctor immediately. ° °FOLLOW-UP:  Please make an appointment with your surgeon as instructed. You do not need to follow up with anesthesia unless specifically instructed to do  so. ° °WOUND CARE INSTRUCTIONS (if applicable):  Keep a dry clean dressing on the anesthesia/puncture wound site if there is drainage.  Once the wound has quit draining you may leave it open to air.  Generally you should leave the bandage intact for twenty four hours unless there is drainage.  If the epidural site drains for more than 36-48 hours please call the anesthesia department. ° °QUESTIONS?:  Please feel free to call your physician or the hospital operator if you have any questions, and they will be happy to assist you.    ° ° ° °

## 2014-05-10 ENCOUNTER — Encounter (HOSPITAL_COMMUNITY): Payer: Self-pay | Admitting: Ophthalmology

## 2014-07-27 ENCOUNTER — Ambulatory Visit: Payer: Managed Care, Other (non HMO) | Admitting: Family Medicine

## 2014-07-29 ENCOUNTER — Ambulatory Visit (INDEPENDENT_AMBULATORY_CARE_PROVIDER_SITE_OTHER): Payer: PRIVATE HEALTH INSURANCE

## 2014-07-29 ENCOUNTER — Encounter: Payer: Self-pay | Admitting: Family Medicine

## 2014-07-29 ENCOUNTER — Ambulatory Visit (INDEPENDENT_AMBULATORY_CARE_PROVIDER_SITE_OTHER): Payer: PRIVATE HEALTH INSURANCE | Admitting: Family Medicine

## 2014-07-29 VITALS — BP 109/61 | HR 74 | Temp 97.3°F | Ht 67.0 in | Wt 110.0 lb

## 2014-07-29 DIAGNOSIS — Z23 Encounter for immunization: Secondary | ICD-10-CM

## 2014-07-29 DIAGNOSIS — M8580 Other specified disorders of bone density and structure, unspecified site: Secondary | ICD-10-CM

## 2014-07-29 DIAGNOSIS — Z0000 Encounter for general adult medical examination without abnormal findings: Secondary | ICD-10-CM

## 2014-07-29 DIAGNOSIS — Z9849 Cataract extraction status, unspecified eye: Secondary | ICD-10-CM

## 2014-07-29 DIAGNOSIS — E559 Vitamin D deficiency, unspecified: Secondary | ICD-10-CM

## 2014-07-29 LAB — POCT CBC
GRANULOCYTE PERCENT: 66.9 % (ref 37–80)
HCT, POC: 40.5 % (ref 37.7–47.9)
Hemoglobin: 13.3 g/dL (ref 12.2–16.2)
LYMPH, POC: 1.7 (ref 0.6–3.4)
MCH, POC: 29.5 pg (ref 27–31.2)
MCHC: 32.8 g/dL (ref 31.8–35.4)
MCV: 90 fL (ref 80–97)
MPV: 7.4 fL (ref 0–99.8)
POC Granulocyte: 4.1 (ref 2–6.9)
POC LYMPH %: 27.6 % (ref 10–50)
Platelet Count, POC: 255 10*3/uL (ref 142–424)
RBC: 4.5 M/uL (ref 4.04–5.48)
RDW, POC: 13.4 %
WBC: 6.2 10*3/uL (ref 4.6–10.2)

## 2014-07-29 MED ORDER — ESTRADIOL 0.5 MG PO TABS: 0.5000 mg | ORAL_TABLET | Freq: Every day | ORAL | Status: DC

## 2014-07-29 MED ORDER — PROGESTERONE MICRONIZED 100 MG PO CAPS: 100.0000 mg | ORAL_CAPSULE | Freq: Every day | ORAL | Status: DC

## 2014-07-29 NOTE — Patient Instructions (Addendum)
Continue current medications. Continue good therapeutic lifestyle changes which include good diet and exercise. Fall precautions discussed with patient. If an FOBT was given today- please return it to our front desk. If you are over 61 years old - you may need Prevnar 13 or the adult Pneumonia vaccine.  Flu Shots will be available at our office starting mid- September. Please call and schedule a FLU CLINIC APPOINTMENT.   Continue aggressive therapeutic lifestyle changes We will call you with the results of your x-ray and lab work is in as those results are available Please schedule for your Pap smear and get your mammogram send as they can be done.

## 2014-07-29 NOTE — Progress Notes (Signed)
Subjective:    Patient ID: Caroline Hodge, female    DOB: 01-26-1951, 63 y.o.   MRN: [id number]  HPI Patient is here today for annual wellness exam and follow up of chronic medical problems. The patient comes to the visit today for her annual exam. She has no specific complaints. She is due for her pelvic exam with the mid-level. This will be done in January. She will be getting a chest x-ray lab work and a flu shot today. Her medications will be refilled today. She is up-to-date on her other health maintenance issues. Her colonoscopy will not be due until next year. Her last DEXA scan was done about one year ago. The patient is due her mammogram         Patient Active Problem List   Diagnosis Date Noted  . Osteopenia 01/11/2013  . Vitamin D deficiency 01/11/2013   Outpatient Encounter Prescriptions as of 07/29/2014  Medication Sig  . Calcium Carbonate-Vitamin D (CALCIUM-VITAMIN D) 500-200 MG-UNIT per tablet Take 1 tablet by mouth daily.   Marland Kitchen estradiol (ESTRACE) 0.5 MG tablet Take 1 tablet (0.5 mg total) by mouth daily.  . progesterone (PROMETRIUM) 100 MG capsule Take 1 capsule (100 mg total) by mouth daily.    Review of Systems  Constitutional: Negative.   HENT: Negative.   Eyes: Negative.   Respiratory: Negative.   Cardiovascular: Negative.   Gastrointestinal: Negative.   Endocrine: Negative.   Genitourinary: Negative.   Musculoskeletal: Negative.   Skin: Negative.   Allergic/Immunologic: Negative.   Neurological: Negative.   Hematological: Negative.   Psychiatric/Behavioral: Negative.        Objective:   Physical Exam  Nursing note and vitals reviewed. Constitutional: She is oriented to person, place, and time. She appears well-developed and well-nourished. No distress.  HENT:  Head: Normocephalic and atraumatic.  Right Ear: External ear normal.  Left Ear: External ear normal.  Nose: Nose normal.  Mouth/Throat: Oropharynx is clear and moist.  Eyes:  Conjunctivae and EOM are normal. Pupils are equal, round, and reactive to light. Right eye exhibits no discharge. Left eye exhibits no discharge. No scleral icterus.  Neck: Normal range of motion. Neck supple. No thyromegaly present.  There are no carotid bruits  Cardiovascular: Normal rate, regular rhythm, normal heart sounds and intact distal pulses.  Exam reveals no gallop and no friction rub.   No murmur heard. At 84 per minute  Pulmonary/Chest: Effort normal and breath sounds normal. No respiratory distress. She has no wheezes. She has no rales. She exhibits no tenderness.  There was no axillary adenopathy  Abdominal: Soft. Bowel sounds are normal. She exhibits no mass. There is no tenderness. There is no rebound and no guarding.  There is no inguinal adenopathy  Musculoskeletal: Normal range of motion. She exhibits no edema and no tenderness.  Lymphadenopathy:    She has no cervical adenopathy.  Neurological: She is alert and oriented to person, place, and time. She has normal reflexes. No cranial nerve deficit.  Skin: Skin is warm and dry. No rash noted.  There are no abnormal skin lesions  Psychiatric: She has a normal mood and affect. Her behavior is normal. Judgment and thought content normal.   BP 109/61  Pulse 74  Temp(Src) 97.3 F (36.3 C) (Oral)  Ht '5\' 7"'  (1.702 m)  Wt 110 lb (49.896 kg)  BMI 17.22 kg/m2   [facility] reading (PRIMARY) by  Dr. Brunilda Payor x-ray--  no active disease  Assessment & Plan:  1. Osteopenia - POCT CBC  2. Vitamin D deficiency - POCT CBC - Vit D  25 hydroxy (rtn osteoporosis monitoring)  3. Annual physical exam - POCT CBC - BMP8+EGFR - Hepatic function panel - NMR, lipoprofile - Vit D  25 hydroxy (rtn osteoporosis monitoring) - Thyroid Panel With TSH - DG Chest 2 View; Future  4. S/P cataract surgery, unspecified laterality  Meds ordered this encounter  Medications  . progesterone (PROMETRIUM) 100  MG capsule    Sig: Take 1 capsule (100 mg total) by mouth daily.    Dispense:  90 capsule    Refill:  3  . estradiol (ESTRACE) 0.5 MG tablet    Sig: Take 1 tablet (0.5 mg total) by mouth daily.    Dispense:  90 tablet    Refill:  3   Patient Instructions  Continue current medications. Continue good therapeutic lifestyle changes which include good diet and exercise. Fall precautions discussed with patient. If an FOBT was given today- please return it to our front desk. If you are over 84 years old - you may need Prevnar 16 or the adult Pneumonia vaccine.  Flu Shots will be available at our office starting mid- September. Please call and schedule a FLU CLINIC APPOINTMENT.   Continue aggressive therapeutic lifestyle changes We will call you with the results of your x-ray and lab work is in as those results are available Please schedule for your Pap smear and get your mammogram send as they can be done.   Arrie Senate MD

## 2014-07-30 LAB — HEPATIC FUNCTION PANEL
ALBUMIN: 4.8 g/dL (ref 3.6–4.8)
ALK PHOS: 47 IU/L (ref 39–117)
ALT: 11 IU/L (ref 0–32)
AST: 18 IU/L (ref 0–40)
Bilirubin, Direct: 0.22 mg/dL (ref 0.00–0.40)
Total Bilirubin: 1.2 mg/dL (ref 0.0–1.2)
Total Protein: 6.7 g/dL (ref 6.0–8.5)

## 2014-07-30 LAB — NMR, LIPOPROFILE
Cholesterol: 182 mg/dL (ref 100–199)
HDL CHOLESTEROL BY NMR: 76 mg/dL (ref >39)
HDL PARTICLE NUMBER: 38.7 umol/L (ref >=30.5)
LDL PARTICLE NUMBER: 881 nmol/L (ref <1000)
LDL Size: 20.9 nm (ref >20.5)
LDLC SERPL CALC-MCNC: 95 mg/dL (ref 0–99)
Small LDL Particle Number: 276 nmol/L (ref <=527)
Triglycerides by NMR: 56 mg/dL (ref 0–149)

## 2014-07-30 LAB — THYROID PANEL WITH TSH
FREE THYROXINE INDEX: 2 (ref 1.2–4.9)
T3 Uptake Ratio: 23 % — ABNORMAL LOW (ref 24–39)
T4 TOTAL: 8.8 ug/dL (ref 4.5–12.0)
TSH: 3.58 u[IU]/mL (ref 0.450–4.500)

## 2014-07-30 LAB — VITAMIN D 25 HYDROXY (VIT D DEFICIENCY, FRACTURES): Vit D, 25-Hydroxy: 25.6 ng/mL — ABNORMAL LOW (ref 30.0–100.0)

## 2014-07-30 LAB — BMP8+EGFR
BUN / CREAT RATIO: 26 (ref 11–26)
BUN: 19 mg/dL (ref 8–27)
CHLORIDE: 101 mmol/L (ref 97–108)
CO2: 25 mmol/L (ref 18–29)
Calcium: 9.3 mg/dL (ref 8.7–10.3)
Creatinine, Ser: 0.72 mg/dL (ref 0.57–1.00)
GFR calc Af Amer: 105 mL/min/{1.73_m2} (ref >59)
GFR calc non Af Amer: 91 mL/min/{1.73_m2} (ref >59)
Glucose: 73 mg/dL (ref 65–99)
POTASSIUM: 3.8 mmol/L (ref 3.5–5.2)
Sodium: 141 mmol/L (ref 134–144)

## 2014-08-01 ENCOUNTER — Telehealth: Payer: Self-pay | Admitting: *Deleted

## 2014-08-01 ENCOUNTER — Telehealth: Payer: Self-pay

## 2014-08-01 NOTE — Telephone Encounter (Signed)
Patient aware, vitamin D low. Script for vitamin D 50,000 u , one per week qty 12 with one refill called to [facility] vm per provider.

## 2014-08-01 NOTE — Telephone Encounter (Signed)
Message copied by Roselee CulverHUMLEY, Cortney Beissel on Mon Aug 01, 2014 11:18 AM ------      Message from: Ernestina PennaMOORE, DONALD W      Created: Fri Jul 29, 2014  3:28 PM       As per radiology report ------

## 2014-08-01 NOTE — Telephone Encounter (Signed)
Pt aware of CXR results.

## 2014-08-01 NOTE — Telephone Encounter (Signed)
Message copied by Almeta MonasSTONE, Jaelyne Deeg M on Mon Aug 01, 2014  4:13 PM ------      Message from: Ernestina PennaMOORE, DONALD W      Created: Sun Jul 31, 2014  6:27 PM       The blood sugar is good at 73. The creatinine, the most important kidney function test is within normal limits. The electrolytes including potassium are within normal limits       All liver function tests are within normal limits      Cholesterol numbers with advanced lipid testing are excellent and at goal-----continue therapeutic lifestyle changes which include diet and exercise      The vitamin D level is low at 25.6.----Start vitamin D 50,000 units one weekly #12 one refill. Recheck vitamin D in 3 months      Thyroid function tests are within normal limits ------

## 2014-10-18 ENCOUNTER — Ambulatory Visit (INDEPENDENT_AMBULATORY_CARE_PROVIDER_SITE_OTHER): Payer: PRIVATE HEALTH INSURANCE | Admitting: Nurse Practitioner

## 2014-10-18 ENCOUNTER — Encounter: Payer: Self-pay | Admitting: Nurse Practitioner

## 2014-10-18 VITALS — BP 112/64 | HR 90 | Temp 97.3°F | Ht 68.0 in | Wt 110.0 lb

## 2014-10-18 DIAGNOSIS — Z01419 Encounter for gynecological examination (general) (routine) without abnormal findings: Secondary | ICD-10-CM

## 2014-10-18 NOTE — Patient Instructions (Signed)
Pap Test A Pap test checks the cells on the surface of your cervix. Your doctor will look for cell changes that are not normal, an infection, or cancer. If the cells no longer look normal, it is called dysplasia. Dysplasia can turn into cancer. Regular Pap tests are important to stop cancer from developing. BEFORE THE PROCEDURE  Ask your doctor when to schedule your Pap test. Timing the test around your period may be important.  Do not douche or have sex (intercourse) for 24 hours before the test.  Do not put creams on your vagina or use tampons for 24 hours before the test.  Go pee (urinate) just before the test. PROCEDURE  You will lie on an exam table with your feet in stirrups.  A warm metal or plastic tool (speculum) will be put in your vagina to open it up.  Your doctor will use a small, plastic brush or wooden spatula to take cells from your cervix.  The cells will be put in a lab container.  The cells will be checked under a microscope to see if they are normal or not. AFTER THE PROCEDURE Get your test results. If they are abnormal, you may need more tests. Document Released: 11/02/2010 Document Revised: 12/23/2011 Document Reviewed: 09/26/2011 ExitCare Patient Information 2015 [employer]. This information is not intended to replace advice given to you by your health care provider. Make sure you discuss any questions you have with your health care provider.  

## 2014-10-18 NOTE — Progress Notes (Signed)
   Subjective:    Patient ID: Caroline MoralesDebra Hodge, female    DOB: 1951/07/18, 64 y.o.   MRN: [id number]  HPI Patient in today for Pap and pelvic- regular patient of dr. Christell ConstantMoore that was seen in October. She is doing well today without complaints.    Review of Systems  Constitutional: Negative.   HENT: Negative.   Respiratory: Negative.   Cardiovascular: Negative.   Gastrointestinal: Negative.   Genitourinary: Negative.   Neurological: Negative.   Psychiatric/Behavioral: Negative.   All other systems reviewed and are negative.      Objective:   Physical Exam  Constitutional: She is oriented to person, place, and time. She appears well-developed and well-nourished.  HENT:  Head: Normocephalic.  Right Ear: Hearing, tympanic membrane, external ear and ear canal normal.  Left Ear: Hearing, tympanic membrane, external ear and ear canal normal.  Nose: Nose normal.  Mouth/Throat: Uvula is midline and oropharynx is clear and moist.  Eyes: Conjunctivae and EOM are normal. Pupils are equal, round, and reactive to light.  Neck: Normal range of motion and full passive range of motion without pain. Neck supple. No JVD present. Carotid bruit is not present. No thyroid mass and no thyromegaly present.  Cardiovascular: Normal rate, normal heart sounds and intact distal pulses.   No murmur heard. Pulmonary/Chest: Effort normal and breath sounds normal. Right breast exhibits no inverted nipple, no mass, no nipple discharge, no skin change and no tenderness. Left breast exhibits no inverted nipple, no mass, no nipple discharge, no skin change and no tenderness.  Abdominal: Soft. Bowel sounds are normal. She exhibits no mass. There is no tenderness.  Genitourinary: Vagina normal and uterus normal. No breast swelling, tenderness, discharge or bleeding.  bimanual exam-No adnexal masses or tenderness. Cervix non parous and pink   Musculoskeletal: Normal range of motion.  Lymphadenopathy:    She has no  cervical adenopathy.  Neurological: She is alert and oriented to person, place, and time.  Skin: Skin is warm and dry.  Psychiatric: She has a normal mood and affect. Her behavior is normal. Judgment and thought content normal.   BP 112/64 mmHg  Pulse 90  Temp(Src) 97.3 F (36.3 C) (Oral)  Ht 5\' 8"  (1.727 m)  Wt 110 lb (49.896 kg)  BMI 16.73 kg/m2        Assessment & Plan:  1. Encounter for routine gynecological examination Diet and exercise encouraged Health  Maintenance updated - Pap IG w/ reflex to HPV when ASC-U  Mary-Margaret Daphine DeutscherMartin, FNP

## 2014-10-19 LAB — PAP IG W/ RFLX HPV ASCU: PAP Smear Comment: 0

## 2015-08-03 ENCOUNTER — Ambulatory Visit (INDEPENDENT_AMBULATORY_CARE_PROVIDER_SITE_OTHER): Payer: PRIVATE HEALTH INSURANCE | Admitting: Family Medicine

## 2015-08-03 ENCOUNTER — Encounter: Payer: Self-pay | Admitting: Family Medicine

## 2015-08-03 VITALS — BP 119/60 | HR 105 | Temp 98.6°F | Ht 68.0 in | Wt 111.0 lb

## 2015-08-03 DIAGNOSIS — Z1211 Encounter for screening for malignant neoplasm of colon: Secondary | ICD-10-CM

## 2015-08-03 DIAGNOSIS — Z0000 Encounter for general adult medical examination without abnormal findings: Secondary | ICD-10-CM | POA: Diagnosis not present

## 2015-08-03 DIAGNOSIS — J069 Acute upper respiratory infection, unspecified: Secondary | ICD-10-CM

## 2015-08-03 DIAGNOSIS — E559 Vitamin D deficiency, unspecified: Secondary | ICD-10-CM

## 2015-08-03 LAB — POCT RAPID STREP A (OFFICE): RAPID STREP A SCREEN: NEGATIVE

## 2015-08-03 MED ORDER — PROGESTERONE MICRONIZED 100 MG PO CAPS: 100.0000 mg | ORAL_CAPSULE | Freq: Every day | ORAL | Status: DC

## 2015-08-03 MED ORDER — ESTRADIOL 0.5 MG PO TABS: 0.5000 mg | ORAL_TABLET | Freq: Every day | ORAL | Status: DC

## 2015-08-03 NOTE — Patient Instructions (Addendum)
Continue current medications. Continue good therapeutic lifestyle changes which include good diet and exercise. Fall precautions discussed with patient. If an FOBT was given today- please return it to our front desk. If you are over 62 years old - you may need Prevnar 13 or the adult Pneumonia vaccine.  **Flu shots are available--- please call and schedule a FLU-CLINIC appointment**  After your visit with us today you will receive a survey in the mail or online from [employer] regarding your care with us. Please take a moment to fill this out. Your feedback is very important to us as you can help us better understand your patient needs as well as improve your experience and satisfaction. WE CARE ABOUT YOU!!!   We will call you with the results of the rapid strep test and the blood work as soon as that becomes available In the meantime, please continue to take the Mucinex and use the nasal saline for head congestion and take the Zyrtec only if needed. Drink plenty of fluids and take Tylenol for aches pains and fever We will also arrange for you to have a colonoscopy Do not forget to return the FOBT card and do not forget to get your mammogram in late November or December.

## 2015-08-03 NOTE — Progress Notes (Signed)
Subjective:    Patient ID: Caroline Hodge, female    DOB: Jul 04, 1951, 64 y.o.   MRN: [id number]  HPI Patient is here today for annual wellness exam and follow up of chronic medical problems which includes vit d def. She is taking medications regularly. The patient has some congestion and cough today and this started about 4 days ago. She has had her pelvic exam and will be due to get her mammogram soon. She is also due to get a DEXA scan as it has almost been 2 years and she has a history of osteopenia. She will be given an FOBT today to return and we'll get lab work today. She needs her flu shot but this will be dependent upon exam we do today. The patient denies chest pain shortness of breath trouble swallowing heartburn indigestion nausea vomiting diarrhea or blood in the stool. She is passing her water without problems. She is due to get her mammogram and her DEXA scan is also in need of getting a colonoscopy and we will make sure that these things get arrange for her.    Patient Active Problem List   Diagnosis Date Noted  . Osteopenia 01/11/2013  . Vitamin D deficiency 01/11/2013   Outpatient Encounter Prescriptions as of 08/03/2015  Medication Sig  . Calcium Carbonate-Vitamin D (CALCIUM-VITAMIN D) 500-200 MG-UNIT per tablet Take 1 tablet by mouth daily.   Marland Kitchen estradiol (ESTRACE) 0.5 MG tablet Take 1 tablet (0.5 mg total) by mouth daily.  . progesterone (PROMETRIUM) 100 MG capsule Take 1 capsule (100 mg total) by mouth daily.  . [DISCONTINUED] estradiol (ESTRACE) 0.5 MG tablet Take 1 tablet (0.5 mg total) by mouth daily.  . [DISCONTINUED] progesterone (PROMETRIUM) 100 MG capsule Take 1 capsule (100 mg total) by mouth daily.   No facility-administered encounter medications on file as of 08/03/2015.      Review of Systems  Constitutional: Negative.   HENT: Positive for congestion.   Eyes: Negative.   Respiratory: Positive for cough.   Cardiovascular: Negative.   Gastrointestinal:  Negative.   Endocrine: Negative.   Genitourinary: Negative.   Musculoskeletal: Negative.   Skin: Negative.   Allergic/Immunologic: Negative.   Neurological: Negative.   Hematological: Negative.   Psychiatric/Behavioral: Negative.        Objective:   Physical Exam  Constitutional: She is oriented to person, place, and time. She appears well-developed and well-nourished. No distress.  HENT:  Head: Normocephalic and atraumatic.  Right Ear: External ear normal.  Left Ear: External ear normal.  Mouth/Throat: Oropharynx is clear and moist.  Minimal nasal congestion  Eyes: Conjunctivae and EOM are normal. Pupils are equal, round, and reactive to light. Right eye exhibits no discharge. Left eye exhibits no discharge. No scleral icterus.  Neck: Normal range of motion. Neck supple. No thyromegaly present.  Without bruits thyromegaly or anterior cervical adenopathy  Cardiovascular: Normal rate, regular rhythm, normal heart sounds and intact distal pulses.  Exam reveals no gallop and no friction rub.   No murmur heard. At 84/m  Pulmonary/Chest: Effort normal and breath sounds normal. No respiratory distress. She has no wheezes. She has no rales. She exhibits no tenderness.  Minimal congestion with coughing  Abdominal: Soft. Bowel sounds are normal. She exhibits no mass. There is no tenderness. There is no rebound and no guarding.  No abdominal tenderness masses or organ enlargement or inguinal adenopathy  Musculoskeletal: Normal range of motion. She exhibits no edema or tenderness.  Lymphadenopathy:    She has  no cervical adenopathy.  Neurological: She is alert and oriented to person, place, and time. She has normal reflexes. No cranial nerve deficit.  Skin: Skin is warm and dry. No rash noted.  Psychiatric: She has a normal mood and affect. Her behavior is normal. Judgment and thought content normal.  Nursing note and vitals reviewed.  BP 119/60 mmHg  Pulse 105  Temp(Src) 98.6 F (37  C) (Oral)  Ht '5\' 8"'  (1.727 m)  Wt 111 lb (50.349 kg)  BMI 16.88 kg/m2        Assessment & Plan:  1. Vitamin D deficiency -Continue with current calcium and vitamin supplements - CBC with Differential/Platelet - Vit D  25 hydroxy (rtn osteoporosis monitoring)  2. Annual physical exam -We will arrange for the patient to get a colonoscopy and a mammogram. -She should return the FOBT - BMP8+EGFR - CBC with Differential/Platelet - Hepatic function panel - Vit D  25 hydroxy (rtn osteoporosis monitoring) - Thyroid Panel With TSH - NMR, lipoprofile  3. Health care maintenance -Patient needs mammogram and colonoscopy and FOBT - BMP8+EGFR - CBC with Differential/Platelet - Hepatic function panel - Vit D  25 hydroxy (rtn osteoporosis monitoring) - Thyroid Panel With TSH - NMR, lipoprofile  4. URI (upper respiratory infection) -She will use Mucinex and nasal saline and gargle with warm salty water and take when necessary Zyrtec.  Meds ordered this encounter  Medications  . estradiol (ESTRACE) 0.5 MG tablet    Sig: Take 1 tablet (0.5 mg total) by mouth daily.    Dispense:  90 tablet    Refill:  3  . progesterone (PROMETRIUM) 100 MG capsule    Sig: Take 1 capsule (100 mg total) by mouth daily.    Dispense:  90 capsule    Refill:  3   Patient Instructions  Continue current medications. Continue good therapeutic lifestyle changes which include good diet and exercise. Fall precautions discussed with patient. If an FOBT was given today- please return it to our front desk. If you are over 20 years old - you may need Prevnar 50 or the adult Pneumonia vaccine.  **Flu shots are available--- please call and schedule a FLU-CLINIC appointment**  After your visit with [place] today you will receive a survey in the mail or online from [employer] regarding your care with [place]. Please take a moment to fill this out. Your feedback is very important to [place] as you can help [place] better understand  your patient needs as well as improve your experience and satisfaction. WE CARE ABOUT YOU!!!   We will call you with the results of the rapid strep test and the blood work as soon as that becomes available In the meantime, please continue to take the Mucinex and use the nasal saline for head congestion and take the Zyrtec only if needed. Drink plenty of fluids and take Tylenol for aches pains and fever We will also arrange for you to have a colonoscopy Do not forget to return the FOBT card and do not forget to get your mammogram in late November or December.   Arrie Senate MD

## 2015-08-03 NOTE — Addendum Note (Signed)
Addended by: Magdalene RiverBULLINS, Nikiah Goin H on: 08/03/2015 10:48 AM   Modules accepted: Orders

## 2015-08-04 LAB — BMP8+EGFR
BUN/Creatinine Ratio: 20 (ref 11–26)
BUN: 13 mg/dL (ref 8–27)
CALCIUM: 9.1 mg/dL (ref 8.7–10.3)
CO2: 26 mmol/L (ref 18–29)
Chloride: 100 mmol/L (ref 97–106)
Creatinine, Ser: 0.64 mg/dL (ref 0.57–1.00)
GFR calc non Af Amer: 96 mL/min/{1.73_m2} (ref >59)
GFR, EST AFRICAN AMERICAN: 111 mL/min/{1.73_m2} (ref >59)
Glucose: 83 mg/dL (ref 65–99)
POTASSIUM: 3.8 mmol/L (ref 3.5–5.2)
Sodium: 141 mmol/L (ref 136–144)

## 2015-08-04 LAB — CBC WITH DIFFERENTIAL/PLATELET
Basophils Absolute: 0 10*3/uL (ref 0.0–0.2)
Basos: 0 %
EOS (ABSOLUTE): 0.1 10*3/uL (ref 0.0–0.4)
Eos: 1 %
HEMOGLOBIN: 13.7 g/dL (ref 11.1–15.9)
Hematocrit: 40.8 % (ref 34.0–46.6)
IMMATURE GRANS (ABS): 0 10*3/uL (ref 0.0–0.1)
IMMATURE GRANULOCYTES: 0 %
LYMPHS: 12 %
Lymphocytes Absolute: 0.9 10*3/uL (ref 0.7–3.1)
MCH: 30.3 pg (ref 26.6–33.0)
MCHC: 33.6 g/dL (ref 31.5–35.7)
MCV: 90 fL (ref 79–97)
MONOCYTES: 13 %
Monocytes Absolute: 1 10*3/uL — ABNORMAL HIGH (ref 0.1–0.9)
NEUTROS ABS: 5.5 10*3/uL (ref 1.4–7.0)
NEUTROS PCT: 74 %
PLATELETS: 232 10*3/uL (ref 150–379)
RBC: 4.52 x10E6/uL (ref 3.77–5.28)
RDW: 13.9 % (ref 12.3–15.4)
WBC: 7.5 10*3/uL (ref 3.4–10.8)

## 2015-08-04 LAB — THYROID PANEL WITH TSH
FREE THYROXINE INDEX: 1.8 (ref 1.2–4.9)
T3 UPTAKE RATIO: 23 % — AB (ref 24–39)
T4, Total: 7.7 ug/dL (ref 4.5–12.0)
TSH: 2.35 u[IU]/mL (ref 0.450–4.500)

## 2015-08-04 LAB — NMR, LIPOPROFILE
Cholesterol: 162 mg/dL (ref 100–199)
HDL CHOLESTEROL BY NMR: 69 mg/dL (ref >39)
HDL Particle Number: 32.9 umol/L (ref >=30.5)
LDL PARTICLE NUMBER: 706 nmol/L (ref <1000)
LDL Size: 20.6 nm (ref >20.5)
LDL-C: 79 mg/dL (ref 0–99)
LP-IR Score: <25 (ref <=45)
SMALL LDL PARTICLE NUMBER: 313 nmol/L (ref <=527)
Triglycerides by NMR: 72 mg/dL (ref 0–149)

## 2015-08-04 LAB — HEPATIC FUNCTION PANEL
ALK PHOS: 53 IU/L (ref 39–117)
ALT: 37 IU/L — ABNORMAL HIGH (ref 0–32)
AST: 33 IU/L (ref 0–40)
Albumin: 4.3 g/dL (ref 3.6–4.8)
BILIRUBIN TOTAL: 0.5 mg/dL (ref 0.0–1.2)
BILIRUBIN, DIRECT: 0.13 mg/dL (ref 0.00–0.40)
TOTAL PROTEIN: 6.3 g/dL (ref 6.0–8.5)

## 2015-08-04 LAB — VITAMIN D 25 HYDROXY (VIT D DEFICIENCY, FRACTURES): Vit D, 25-Hydroxy: 27.1 ng/mL — ABNORMAL LOW (ref 30.0–100.0)

## 2015-08-10 ENCOUNTER — Other Ambulatory Visit: Payer: Self-pay | Admitting: *Deleted

## 2015-08-10 MED ORDER — VITAMIN D (ERGOCALCIFEROL) 1.25 MG (50000 UNIT) PO CAPS: 50000.0000 [IU] | ORAL_CAPSULE | ORAL | Status: DC

## 2015-08-10 NOTE — Progress Notes (Signed)
Pt notified of results Verbalizes understanding 

## 2015-08-24 ENCOUNTER — Encounter: Payer: Self-pay | Admitting: *Deleted

## 2015-08-28 ENCOUNTER — Ambulatory Visit (INDEPENDENT_AMBULATORY_CARE_PROVIDER_SITE_OTHER): Payer: PRIVATE HEALTH INSURANCE

## 2015-08-28 DIAGNOSIS — Z23 Encounter for immunization: Secondary | ICD-10-CM

## 2015-10-24 ENCOUNTER — Other Ambulatory Visit: Payer: PRIVATE HEALTH INSURANCE

## 2015-10-24 DIAGNOSIS — R799 Abnormal finding of blood chemistry, unspecified: Secondary | ICD-10-CM

## 2015-10-24 NOTE — Progress Notes (Signed)
Lab only 

## 2015-10-25 ENCOUNTER — Encounter: Payer: Self-pay | Admitting: Internal Medicine

## 2015-10-25 LAB — HEPATIC FUNCTION PANEL
ALK PHOS: 48 IU/L (ref 39–117)
ALT: 13 IU/L (ref 0–32)
AST: 19 IU/L (ref 0–40)
Albumin: 4.4 g/dL (ref 3.6–4.8)
BILIRUBIN TOTAL: 1 mg/dL (ref 0.0–1.2)
Bilirubin, Direct: 0.2 mg/dL (ref 0.00–0.40)
Total Protein: 6.6 g/dL (ref 6.0–8.5)

## 2015-10-30 ENCOUNTER — Encounter: Payer: Self-pay | Admitting: Family Medicine

## 2015-10-30 ENCOUNTER — Ambulatory Visit (INDEPENDENT_AMBULATORY_CARE_PROVIDER_SITE_OTHER): Payer: PRIVATE HEALTH INSURANCE | Admitting: Family Medicine

## 2015-10-30 VITALS — BP 139/67 | HR 81 | Temp 97.7°F | Ht 68.0 in | Wt 114.2 lb

## 2015-10-30 DIAGNOSIS — R0989 Other specified symptoms and signs involving the circulatory and respiratory systems: Secondary | ICD-10-CM

## 2015-10-30 DIAGNOSIS — M542 Cervicalgia: Secondary | ICD-10-CM

## 2015-10-30 MED ORDER — ASPIRIN EC 81 MG PO TBEC: 81.0000 mg | DELAYED_RELEASE_TABLET | Freq: Every day | ORAL | Status: AC

## 2015-10-30 NOTE — Progress Notes (Signed)
Subjective:  Patient ID: Caroline Hodge Dunckel, female    DOB: 10-13-1951  Age: 65 y.o. MRN: [id number]  CC: tightness in throat   HPI Caroline Hodge Dragoo presents for several weeks of tightness of right side of neck. She denies any problems with swelling in the throat.  History Stanton KidneyDebra has a past medical history of Osteopenia and Vitamin D deficiency.   She has past surgical history that includes Skin graft (Left, 1975); Colonoscopy (2006); Cataract extraction w/PHACO (Right, 04/25/2014); and Cataract extraction w/PHACO (Left, 05/09/2014).   Her family history includes Healthy in her father and mother.She reports that she has never smoked. She has never used smokeless tobacco. She reports that she drinks about 1.1 oz of alcohol per week. She reports that she does not use illicit drugs.  Outpatient Prescriptions Prior to Visit  Medication Sig Dispense Refill  . Calcium Carbonate-Vitamin D (CALCIUM-VITAMIN D) 500-200 MG-UNIT per tablet Take 1 tablet by mouth daily.     Marland Kitchen. estradiol (ESTRACE) 0.5 MG tablet Take 1 tablet (0.5 mg total) by mouth daily. 90 tablet 3  . progesterone (PROMETRIUM) 100 MG capsule Take 1 capsule (100 mg total) by mouth daily. 90 capsule 3  . Vitamin D, Ergocalciferol, (DRISDOL) 50000 UNITS CAPS capsule Take 1 capsule (50,000 Units total) by mouth every 7 (seven) days. 12 capsule 1   No facility-administered medications prior to visit.    ROS Review of Systems  Constitutional: Negative for fever, activity change and appetite change.  HENT: Negative for congestion, rhinorrhea and sore throat.   Eyes: Negative for visual disturbance.  Respiratory: Negative for cough and shortness of breath.   Cardiovascular: Negative for chest pain and palpitations.  Gastrointestinal: Negative for nausea, abdominal pain and diarrhea.  Genitourinary: Negative for dysuria.  Musculoskeletal: Negative for myalgias and arthralgias.    Objective:  BP 139/67 mmHg  Pulse 81  Temp(Src) 97.7 F  (36.5 C) (Oral)  Ht 5\' 8"  (1.727 m)  Wt 114 lb 3.2 oz (51.801 kg)  BMI 17.37 kg/m2  BP Readings from Last 3 Encounters:  10/30/15 139/67  08/03/15 119/60  10/18/14 112/64    Wt Readings from Last 3 Encounters:  10/30/15 114 lb 3.2 oz (51.801 kg)  08/03/15 111 lb (50.349 kg)  10/18/14 110 lb (49.896 kg)     Physical Exam  Constitutional: She is oriented to person, place, and time. She appears well-developed and well-nourished. No distress.  HENT:  Head: Normocephalic and atraumatic.  Eyes: Conjunctivae are normal. Pupils are equal, round, and reactive to light.  Neck: Normal range of motion. Neck supple. No JVD present. No tracheal deviation present. No thyromegaly present.  Cardiovascular: Normal rate, regular rhythm and normal heart sounds.   No murmur heard. Pulmonary/Chest: Effort normal and breath sounds normal. No respiratory distress. She has no wheezes. She has no rales.  Abdominal: Soft. Bowel sounds are normal. She exhibits no distension. There is no tenderness.  Musculoskeletal: Normal range of motion. She exhibits tenderness (R cervical strap musculature).  Lymphadenopathy:    She has no cervical adenopathy.  Neurological: She is alert and oriented to person, place, and time.  Skin: Skin is warm and dry.  Psychiatric: She has a normal mood and affect. Her behavior is normal. Judgment and thought content normal.     Lab Results  Component Value Date   WBC 7.5 08/03/2015   HGB 13.3 07/29/2014   HCT 40.8 08/03/2015   PLT 232 08/03/2015   GLUCOSE 83 08/03/2015   CHOL 162 08/03/2015  TRIG 72 08/03/2015   HDL 69 08/03/2015   LDLCALC 95 07/29/2014   ALT 13 10/24/2015   AST 19 10/24/2015   NA 141 08/03/2015   K 3.8 08/03/2015   CL 100 08/03/2015   CREATININE 0.64 08/03/2015   BUN 13 08/03/2015   CO2 26 08/03/2015   TSH 2.350 08/03/2015    No results found.  Assessment & Plan:   Seryna was seen today for tightness in throat.  Diagnoses and all  orders for this visit:  Bilateral carotid bruits -     US Soft Tissue Head/Neck; Future -     US Carotid Duplex Bilateral; Future  Neck pain -     US Soft Tissue Head/Neck; Future -     US Carotid Duplex Bilateral; Future  Other orders -     aspirin EC 81 MG tablet; Take 1 tablet (81 mg total) by mouth daily.   I am having Ms. Fishman start on aspirin EC. I am also having her maintain her calcium-vitamin D, estradiol, progesterone, and Vitamin D (Ergocalciferol).  Meds ordered this encounter  Medications  . aspirin EC 81 MG tablet    Sig: Take 1 tablet (81 mg total) by mouth daily.    Dispense:  100 tablet    Refill:  11     Follow-up: Return if symptoms worsen or fail to improve.  Mechele Claude, M.D.

## 2015-11-03 ENCOUNTER — Ambulatory Visit (HOSPITAL_COMMUNITY)
Admission: RE | Admit: 2015-11-03 | Discharge: 2015-11-03 | Disposition: A | Discharge status: Home / Self Care | Payer: PRIVATE HEALTH INSURANCE | Source: Ambulatory Visit | Attending: Family Medicine | Admitting: Family Medicine

## 2015-11-03 DIAGNOSIS — I6523 Occlusion and stenosis of bilateral carotid arteries: Secondary | ICD-10-CM | POA: Diagnosis not present

## 2015-11-03 DIAGNOSIS — R0989 Other specified symptoms and signs involving the circulatory and respiratory systems: Secondary | ICD-10-CM

## 2015-11-03 DIAGNOSIS — R221 Localized swelling, mass and lump, neck: Secondary | ICD-10-CM | POA: Diagnosis present

## 2015-11-03 DIAGNOSIS — M542 Cervicalgia: Secondary | ICD-10-CM

## 2015-11-10 ENCOUNTER — Other Ambulatory Visit: Payer: Self-pay | Admitting: *Deleted

## 2015-11-10 MED ORDER — ROSUVASTATIN CALCIUM 5 MG PO TABS: 5.0000 mg | ORAL_TABLET | Freq: Every day | ORAL | Status: DC

## 2015-11-13 ENCOUNTER — Encounter: Payer: Self-pay | Admitting: Family Medicine

## 2015-11-13 ENCOUNTER — Ambulatory Visit (INDEPENDENT_AMBULATORY_CARE_PROVIDER_SITE_OTHER): Payer: PRIVATE HEALTH INSURANCE | Admitting: Family Medicine

## 2015-11-13 VITALS — BP 126/72 | HR 80 | Temp 97.0°F | Ht 68.0 in | Wt 111.0 lb

## 2015-11-13 DIAGNOSIS — E785 Hyperlipidemia, unspecified: Secondary | ICD-10-CM

## 2015-11-13 DIAGNOSIS — I779 Disorder of arteries and arterioles, unspecified: Secondary | ICD-10-CM | POA: Diagnosis not present

## 2015-11-13 DIAGNOSIS — R0989 Other specified symptoms and signs involving the circulatory and respiratory systems: Secondary | ICD-10-CM

## 2015-11-13 DIAGNOSIS — R140 Abdominal distension (gaseous): Secondary | ICD-10-CM | POA: Diagnosis not present

## 2015-11-13 DIAGNOSIS — R109 Unspecified abdominal pain: Secondary | ICD-10-CM

## 2015-11-13 DIAGNOSIS — R6889 Other general symptoms and signs: Secondary | ICD-10-CM

## 2015-11-13 DIAGNOSIS — E559 Vitamin D deficiency, unspecified: Secondary | ICD-10-CM | POA: Diagnosis not present

## 2015-11-13 DIAGNOSIS — I739 Peripheral vascular disease, unspecified: Principal | ICD-10-CM

## 2015-11-13 DIAGNOSIS — R198 Other specified symptoms and signs involving the digestive system and abdomen: Secondary | ICD-10-CM | POA: Diagnosis not present

## 2015-11-13 NOTE — Patient Instructions (Addendum)
Continue to gargle with warm salty water Take vitamin D weekly for 4 more weeks Return to clinic in 4 weeks and check vitamin D level and liver function tests because of the initiation of Crestor A 1 time visit scheduled with the vascular surgeon because of the new finding of carotid stenosis Continue with Crestor therapy and aggressive therapeutic lifestyle changes. Because of abdominal discomfort probably related to gas we will get an ultrasound of the abdomen this will be arranged at [facility]--- The patient will get a lipid liver in about 4 months.

## 2015-11-13 NOTE — Progress Notes (Signed)
Subjective:    Patient ID: Caroline Hodge, female    DOB: [birth year], 65 y.o.   MRN: [id number]  HPI Patient here today to discuss recent tests and cholesterol. She also complains that her throat feels swollen. The patient has been with warm salty water over the weekend her throat ask he feels better. The fullness was on the right side. She never had a sore throat. She has been taking vitamin D 50,000 units but has taken this in the past. She denies any chest pain or shortness of breath. We further discussed the finding of the carotid stenosis. She has had great cholesterol levels without any medication in the past. I explained to her that some people genetically may have problems with arteries that are more blocked. She is concerned about this nonetheless. She will continue to work with her diet and will take the Crestor as planned and we will arrange for her to have an appointment with the vascular surgeon to answer any further questions she may have regarding the blockages that were found with the Doppler studies.      Patient Active Problem List   Diagnosis Date Noted  . Osteopenia 01/11/2013  . Vitamin D deficiency 01/11/2013   Outpatient Encounter Prescriptions as of 11/13/2015  Medication Sig  . aspirin EC 81 MG tablet Take 1 tablet (81 mg total) by mouth daily.  . Calcium Carbonate-Vitamin D (CALCIUM-VITAMIN D) 500-200 MG-UNIT per tablet Take 1 tablet by mouth daily.   Marland Kitchen estradiol (ESTRACE) 0.5 MG tablet Take 1 tablet (0.5 mg total) by mouth daily.  . progesterone (PROMETRIUM) 100 MG capsule Take 1 capsule (100 mg total) by mouth daily.  . rosuvastatin (CRESTOR) 5 MG tablet Take 1 tablet (5 mg total) by mouth daily.  . [DISCONTINUED] Vitamin D, Ergocalciferol, (DRISDOL) 50000 UNITS CAPS capsule Take 1 capsule (50,000 Units total) by mouth every 7 (seven) days.   No facility-administered encounter medications on file as of 11/13/2015.      Review of Systems  Constitutional:  Negative.   HENT: Negative.   Eyes: Negative.   Respiratory: Negative.   Cardiovascular: Negative.   Gastrointestinal: Negative.   Endocrine: Negative.   Genitourinary: Negative.   Musculoskeletal: Negative.   Skin: Negative.   Allergic/Immunologic: Negative.   Neurological: Negative.   Hematological: Negative.   Psychiatric/Behavioral: Negative.        Objective:   Physical Exam  Constitutional: She is oriented to person, place, and time. She appears well-developed and well-nourished. No distress.  HENT:  Head: Normocephalic and atraumatic.  Right Ear: External ear normal.  Left Ear: External ear normal.  Mouth/Throat: Oropharynx is clear and moist.  Slight nasal congestion on the left  Eyes: Conjunctivae and EOM are normal. Pupils are equal, round, and reactive to light. Right eye exhibits no discharge. Left eye exhibits no discharge. No scleral icterus.  Neck: Normal range of motion. Neck supple. No thyromegaly present.  No thyromegaly no adenopathy and no abnormal masses  Cardiovascular: Normal rate, regular rhythm and normal heart sounds.   No murmur heard. Pulmonary/Chest: Effort normal and breath sounds normal. No respiratory distress. She has no wheezes. She has no rales. She exhibits no tenderness.  Clear anteriorly and posteriorly  Abdominal: Soft. Bowel sounds are normal. She exhibits no mass. There is no tenderness. There is no rebound and no guarding.  There is no tenderness or organ enlargement or masses or bruits  Musculoskeletal: Normal range of motion. She exhibits no edema.  Lymphadenopathy:  She has no cervical adenopathy.  Neurological: She is alert and oriented to person, place, and time.  Skin: Skin is warm and dry. No rash noted.  Psychiatric: She has a normal mood and affect. Her behavior is normal. Judgment and thought content normal.  Nursing note and vitals reviewed.  BP 126/72 mmHg  Pulse 80  Temp(Src) 97 F (36.1 C) (Oral)  Ht _0  (1.727  m)  Wt 111 lb (50.349 kg)  BMI 16.88 kg/m2        Assessment & Plan:  1. Right-sided carotid artery disease ([place]) -Continue Crestor and aggressive therapeutic lifestyle changes - Ambulatory referral to Vascular Surgery  2. Abdominal discomfort -The patient has no symptoms of reflux. - US Abdomen Complete; Future  3. Bloating -This is only occasional nature. - US Abdomen Complete; Future  4. Hyperlipidemia -The patient has had cholesterol that has been under good control but has been started on Crestor due to the fact and finding of carotid stenosis right greater than left - Hepatic function panel; Future - Lipid panel; Future - BMP8+EGFR; Future - Hepatic function panel; Future  5. Vitamin D deficiency -Continue vitamin D 50,000 units weekly and check vitamin D level in about [redacted] weeks along with liver function test - VITAMIN D 25 Hydroxy (Vit-D Deficiency, Fractures); Future  6. Throat fullness -Continue gargle with warm salty water -If throat symptoms recur the patient should call us back  Patient Instructions  Continue to gargle with warm salty water Take vitamin D weekly for 4 more weeks Return to clinic in 4 weeks and check vitamin D level and liver function tests because of the initiation of Crestor A 1 time visit scheduled with the vascular surgeon because of the new finding of carotid stenosis Continue with Crestor therapy and aggressive therapeutic lifestyle changes. Because of abdominal discomfort probably related to gas we will get an ultrasound of the abdomen this will be arranged at [facility]--- The patient will get a lipid liver in about 4 months.   Arrie Senate MD

## 2015-11-23 ENCOUNTER — Encounter: Payer: Self-pay | Admitting: Vascular Surgery

## 2015-11-23 ENCOUNTER — Ambulatory Visit (INDEPENDENT_AMBULATORY_CARE_PROVIDER_SITE_OTHER): Payer: PRIVATE HEALTH INSURANCE | Admitting: Vascular Surgery

## 2015-11-23 VITALS — BP 133/74 | HR 85 | Temp 97.5°F | Resp 18 | Ht 67.0 in | Wt 111.3 lb

## 2015-11-23 DIAGNOSIS — I6521 Occlusion and stenosis of right carotid artery: Secondary | ICD-10-CM

## 2015-11-23 NOTE — Addendum Note (Signed)
Addended by: Adria Dill L on: 11/23/2015 09:21 AM   Modules accepted: Orders

## 2015-11-23 NOTE — Progress Notes (Signed)
[facility]  Patient name: Caroline Hodge MRN: [id number] DOB: 1951-07-15 Sex: female  REASON FOR CONSULT: Asymptomatic carotid stenosis  HPI: Caroline Hodge is a 65 y.o. female, who is seen today for discussion of recent carotid duplex revealing right carotid stenosis. She is completely asymptomatic. She was noted to have a carotid bruit on physical exam and underwent duplex follow-up. I have her duplex results and have reviewed these with the patient. He denies any prior episodes of amaurosis fugax, transient ischemic attack or stroke. She is right-handed. She has no history of cardiac or peripheral vascular occlusive disease. Her family history is negative for any premature atherosclerotic disease. Her father is 32 and mother is 42 and doing well. She has never smoked.  Past Medical History  Diagnosis Date  . Osteopenia   . Vitamin D deficiency   . Carotid artery occlusion   . Personal history of kidney stones 2007    Family History  Problem Relation Age of Onset  . Healthy Mother   . Healthy Father     SOCIAL HISTORY: Social History   Social History  . Marital Status: Single    Spouse Name: N/A  . Number of Children: N/A  . Years of Education: N/A   Occupational History  . retired    Social History Main Topics  . Smoking status: Never Smoker   . Smokeless tobacco: Never Used  . Alcohol Use: 1.1 oz/week    1 Glasses of wine, 1 Standard drinks or equivalent per week  . Drug Use: No  . Sexual Activity: Yes   Other Topics Concern  . Not on file   Social History Narrative    No Known Allergies  Current Outpatient Prescriptions  Medication Sig Dispense Refill  . aspirin EC 81 MG tablet Take 1 tablet (81 mg total) by mouth daily. 100 tablet 11  . Calcium Carbonate-Vitamin D (CALCIUM-VITAMIN D) 500-200 MG-UNIT per tablet Take 1 tablet by mouth daily.     . ergocalciferol (VITAMIN D2) 50000 units capsule Take 50,000 Units by mouth  once a week.    . estradiol (ESTRACE) 0.5 MG tablet Take 1 tablet (0.5 mg total) by mouth daily. 90 tablet 3  . progesterone (PROMETRIUM) 100 MG capsule Take 1 capsule (100 mg total) by mouth daily. 90 capsule 3  . rosuvastatin (CRESTOR) 5 MG tablet Take 1 tablet (5 mg total) by mouth daily. 90 tablet 3   No current facility-administered medications for this visit.    REVIEW OF SYSTEMS:   denotes positive finding,  denotes negative finding Cardiac  Comments:  Chest pain or chest pressure:    Shortness of breath upon exertion:    Short of breath when lying flat:    Irregular heart rhythm:        Vascular    Pain in calf, thigh, or hip brought on by ambulation:    Pain in feet at night that wakes you up from your sleep:     Blood clot in your veins:    Leg swelling:         Pulmonary    Oxygen at home:    Productive cough:     Wheezing:         Neurologic    Sudden weakness in arms or legs:     Sudden numbness in arms or legs:     Sudden onset of difficulty speaking or slurred speech:    Temporary loss of vision in one  eye:     Problems with dizziness:         Gastrointestinal    Blood in stool:     Vomited blood:         Genitourinary    Burning when urinating:     Blood in urine:        Psychiatric    Major depression:         Hematologic    Bleeding problems:    Problems with blood clotting too easily:        Skin    Rashes or ulcers:        Constitutional    Fever or chills:      PHYSICAL EXAM: Filed Vitals:   11/23/15 0844  BP: 133/74  Pulse: 85  Temp: 97.5 F (36.4 C)  TempSrc: Oral  Resp: 18  Height:  (1.702 m)  Weight: 111 lb 4.8 oz (50.485 kg)  SpO2: 100%    GENERAL: The patient is a well-nourished female, in no acute distress. The vital signs are documented above. CARDIAC: There is a regular rate and rhythm.  VASCULAR: She does have a soft right carotid bruit and on the left. She has 2+ radial 2+ femoral and 2+ dorsalis pedis  pulses bilaterally PULMONARY: There is good air exchange bilaterally without wheezing or rales. ABDOMEN: Soft and non-tender no masses noted MUSCULOSKELETAL: There are no major deformities or cyanosis. NEUROLOGIC: No focal weakness or paresthesias are detected. SKIN: There are no ulcers or rashes noted. PSYCHIATRIC: The patient has a normal affect.  DATA:  Carotid duplex from outlying lab was reviewed. This showed no stenosis on the left. Her velocities on the right internal carotid were in the 220 systolic range.  MEDICAL ISSUES: Asymptomatic right internal carotid artery stenosis. Had long discussion with the patient. Explained that this is not with her in a statistical increased risk for stroke. Would recommend yearly carotid duplex follow-up. I did discuss symptoms of carotid disease disease with her she knows to report a meal he should this occur. Also explained treatment of carotid stenosis if she progressed to greater than 80% stenosis. She was relieved with this discussion will see [place] again in one year   Rio Taber [facility] [employer]: [phone]

## 2015-11-27 ENCOUNTER — Ambulatory Visit (HOSPITAL_COMMUNITY)
Admission: RE | Admit: 2015-11-27 | Discharge: 2015-11-27 | Disposition: A | Discharge status: Home / Self Care | Payer: PRIVATE HEALTH INSURANCE | Source: Ambulatory Visit | Attending: Family Medicine | Admitting: Family Medicine

## 2015-11-27 DIAGNOSIS — R109 Unspecified abdominal pain: Secondary | ICD-10-CM | POA: Insufficient documentation

## 2015-11-27 DIAGNOSIS — N2889 Other specified disorders of kidney and ureter: Secondary | ICD-10-CM | POA: Diagnosis not present

## 2015-11-27 DIAGNOSIS — N270 Small kidney, unilateral: Secondary | ICD-10-CM | POA: Insufficient documentation

## 2015-11-27 DIAGNOSIS — R140 Abdominal distension (gaseous): Secondary | ICD-10-CM | POA: Insufficient documentation

## 2015-12-06 ENCOUNTER — Ambulatory Visit (AMBULATORY_SURGERY_CENTER): Payer: Self-pay | Admitting: *Deleted

## 2015-12-06 VITALS — Ht 67.0 in | Wt 115.0 lb

## 2015-12-06 DIAGNOSIS — Z1211 Encounter for screening for malignant neoplasm of colon: Secondary | ICD-10-CM

## 2015-12-06 MED ORDER — NA SULFATE-K SULFATE-MG SULF 17.5-3.13-1.6 GM/177ML PO SOLN: ORAL | Status: DC

## 2015-12-06 NOTE — Progress Notes (Signed)
No allergies to eggs or soy. No problems with anesthesia.  Pt given Emmi instructions for colonoscopy  No oxygen use  No diet drug use  

## 2015-12-12 ENCOUNTER — Other Ambulatory Visit (INDEPENDENT_AMBULATORY_CARE_PROVIDER_SITE_OTHER): Payer: PRIVATE HEALTH INSURANCE

## 2015-12-12 ENCOUNTER — Encounter: Payer: PRIVATE HEALTH INSURANCE | Admitting: *Deleted

## 2015-12-12 DIAGNOSIS — E785 Hyperlipidemia, unspecified: Secondary | ICD-10-CM

## 2015-12-12 DIAGNOSIS — E559 Vitamin D deficiency, unspecified: Secondary | ICD-10-CM

## 2015-12-12 LAB — HM MAMMOGRAPHY: HM MAMMO: NEGATIVE

## 2015-12-12 NOTE — Addendum Note (Signed)
Addended by: Orma Render F on: 12/12/2015 10:35 AM   Modules accepted: Orders

## 2015-12-12 NOTE — Progress Notes (Signed)
Lab only 

## 2015-12-15 ENCOUNTER — Encounter: Payer: Self-pay | Admitting: *Deleted

## 2015-12-15 LAB — HEPATIC FUNCTION PANEL
ALBUMIN: 4.5 g/dL (ref 3.6–4.8)
ALT: 14 IU/L (ref 0–32)
AST: 17 IU/L (ref 0–40)
Alkaline Phosphatase: 41 IU/L (ref 39–117)
BILIRUBIN TOTAL: 0.9 mg/dL (ref 0.0–1.2)
BILIRUBIN, DIRECT: 0.25 mg/dL (ref 0.00–0.40)
Total Protein: 6.3 g/dL (ref 6.0–8.5)

## 2015-12-15 LAB — LIPID PANEL
Chol/HDL Ratio: 1.9 ratio units (ref 0.0–4.4)
Cholesterol, Total: 127 mg/dL (ref 100–199)
HDL: 67 mg/dL (ref >39)
LDL Calculated: 49 mg/dL (ref 0–99)
TRIGLYCERIDES: 55 mg/dL (ref 0–149)
VLDL CHOLESTEROL CAL: 11 mg/dL (ref 5–40)

## 2015-12-15 LAB — BMP8+EGFR
BUN / CREAT RATIO: 25 (ref 11–26)
BUN: 18 mg/dL (ref 8–27)
CO2: 26 mmol/L (ref 18–29)
CREATININE: 0.71 mg/dL (ref 0.57–1.00)
Calcium: 8.9 mg/dL (ref 8.7–10.3)
Chloride: 105 mmol/L (ref 96–106)
GFR calc non Af Amer: 91 mL/min/{1.73_m2} (ref >59)
GFR, EST AFRICAN AMERICAN: 105 mL/min/{1.73_m2} (ref >59)
Glucose: 80 mg/dL (ref 65–99)
Potassium: 4.1 mmol/L (ref 3.5–5.2)
SODIUM: 143 mmol/L (ref 134–144)

## 2015-12-15 LAB — CBC WITH DIFFERENTIAL/PLATELET
BASOS: 0 %
Basophils Absolute: 0 10*3/uL (ref 0.0–0.2)
EOS (ABSOLUTE): 0.2 10*3/uL (ref 0.0–0.4)
EOS: 4 %
HEMATOCRIT: 39.5 % (ref 34.0–46.6)
HEMOGLOBIN: 13.1 g/dL (ref 11.1–15.9)
Immature Grans (Abs): 0 10*3/uL (ref 0.0–0.1)
Immature Granulocytes: 0 %
LYMPHS ABS: 1.6 10*3/uL (ref 0.7–3.1)
Lymphs: 26 %
MCH: 30.6 pg (ref 26.6–33.0)
MCHC: 33.2 g/dL (ref 31.5–35.7)
MCV: 92 fL (ref 79–97)
MONOCYTES: 9 %
MONOS ABS: 0.5 10*3/uL (ref 0.1–0.9)
NEUTROS ABS: 3.7 10*3/uL (ref 1.4–7.0)
Neutrophils: 61 %
Platelets: 228 10*3/uL (ref 150–379)
RBC: 4.28 x10E6/uL (ref 3.77–5.28)
RDW: 14 % (ref 12.3–15.4)
WBC: 6.1 10*3/uL (ref 3.4–10.8)

## 2015-12-15 LAB — VITAMIN D 25 HYDROXY (VIT D DEFICIENCY, FRACTURES): VIT D 25 HYDROXY: 75.3 ng/mL (ref 30.0–100.0)

## 2015-12-20 ENCOUNTER — Encounter: Payer: PRIVATE HEALTH INSURANCE | Admitting: Internal Medicine

## 2015-12-25 ENCOUNTER — Encounter: Payer: Self-pay | Admitting: Internal Medicine

## 2015-12-25 ENCOUNTER — Ambulatory Visit (AMBULATORY_SURGERY_CENTER): Payer: PRIVATE HEALTH INSURANCE | Admitting: Internal Medicine

## 2015-12-25 VITALS — BP 107/56 | HR 74 | Temp 99.6°F | Resp 18 | Ht 67.0 in | Wt 115.0 lb

## 2015-12-25 DIAGNOSIS — Z1211 Encounter for screening for malignant neoplasm of colon: Secondary | ICD-10-CM | POA: Diagnosis present

## 2015-12-25 MED ORDER — SODIUM CHLORIDE 0.9 % IV SOLN: 500.0000 mL | INTRAVENOUS | Status: DC

## 2015-12-25 NOTE — Op Note (Signed)
[facility] Patient Name: Caroline Hodge Procedure Date: 12/25/2015 12:02 PM MRN: [id number] Endoscopist: Caroline Hodge , MD Age: 65 Referring MD:  Date of Birth: 1950/12/27 Gender: Female Procedure:                Colonoscopy Indications:              Screening for colorectal malignant neoplasm, Last                            colonoscopy 11 years ago Caroline Hodge normal in 2006) Medicines:                Monitored Anesthesia Care Procedure:                Pre-Anesthesia Assessment:                           - Prior to the procedure, a History and Physical                            was performed, and patient medications and                            allergies were reviewed. The patient's tolerance of                            previous anesthesia was also reviewed. The risks                            and benefits of the procedure and the sedation                            options and risks were discussed with the patient.                            All questions were answered, and informed consent                            was obtained. Prior Anticoagulants: The patient has                            taken no previous anticoagulant or antiplatelet                            agents. ASA Grade Assessment: II - A patient with                            mild systemic disease. After reviewing the risks                            and benefits, the patient was deemed in                            satisfactory condition to undergo the procedure.  After obtaining informed consent, the colonoscope                            was passed under direct vision. Throughout the                            procedure, the patient's blood pressure, pulse, and                            oxygen saturations were monitored continuously. The                            Model PCF-H190L [id number]) scope was introduced                            through the anus and advanced  to the the cecum,                            identified by appendiceal orifice and ileocecal                            valve. The colonoscopy was performed without                            difficulty. The patient tolerated the procedure                            well. The quality of the bowel preparation was                            good. The ileocecal valve, appendiceal orifice, and                            rectum were photographed. Scope In: 12:11:49 PM Scope Out: 12:25:24 PM Scope Withdrawal Time: 0 hours 8 minutes 59 seconds  Total Procedure Duration: 0 hours 13 minutes 35 seconds  Findings:      Multiple diverticula were found in the sigmoid colon. There was       narrowing of the colon in association with the diverticular opening.      The exam was otherwise normal throughout the examined colon.      The retroflexed view of the distal rectum and anal verge was normal and       showed no anal or rectal abnormalities. Complications:            No immediate complications. Estimated Blood Loss:     Estimated blood loss: none. Impression:               - Mild to moderate diverticulosis in the sigmoid                            colon.                           - The distal rectum and anal verge are normal on  retroflexion view.                           - No specimens collected. Recommendation:           - Patient has a contact number available for                            emergencies. The signs and symptoms of potential                            delayed complications were discussed with the                            patient. Return to normal activities tomorrow.                            Written discharge instructions were provided to the                            patient.                           - High fiber diet.                           - Continue present medications.                           - Repeat colonoscopy in 10 years for  screening                            purposes. Procedure Code(s):        --- Professional ---                           [id number], Colorectal cancer screening; colonoscopy on                            individual not meeting criteria for high risk CPT copyright 2016 American Medical Association. All rights reserved. Caroline CaddyJay M. Rhea BeltonPyrtle, MD Caroline FiedlerJay M Pyrtle, MD 12/25/2015 12:29:36 PM This report has been signed electronically. Number of Addenda: 0

## 2015-12-25 NOTE — Progress Notes (Signed)
A/ox3, pleased with MAC, report to RN 

## 2015-12-25 NOTE — Patient Instructions (Signed)
Impressions/recommendations:  Diverticulosis (handout given) High Fiber Diet (handout given)  Repeat colonoscopy in 10 years.  YOU HAD AN ENDOSCOPIC PROCEDURE TODAY AT THE [facility]:   Refer to the procedure report that was given to you for any specific questions about what was found during the examination.  If the procedure report does not answer your questions, please call your gastroenterologist to clarify.  If you requested that your care partner not be given the details of your procedure findings, then the procedure report has been included in a sealed envelope for you to review at your convenience later.  YOU SHOULD EXPECT: Some feelings of bloating in the abdomen. Passage of more gas than usual.  Walking can help get rid of the air that was put into your GI tract during the procedure and reduce the bloating. If you had a lower endoscopy (such as a colonoscopy or flexible sigmoidoscopy) you may notice spotting of blood in your stool or on the toilet paper. If you underwent a bowel prep for your procedure, you may not have a normal bowel movement for a few days.  Please Note:  You might notice some irritation and congestion in your nose or some drainage.  This is from the oxygen used during your procedure.  There is no need for concern and it should clear up in a day or so.  SYMPTOMS TO REPORT IMMEDIATELY:   Following lower endoscopy (colonoscopy or flexible sigmoidoscopy):  Excessive amounts of blood in the stool  Significant tenderness or worsening of abdominal pains  Swelling of the abdomen that is new, acute  Fever of 100F or higher   For urgent or emergent issues, a gastroenterologist can be reached at any hour by calling [phone].   DIET: Your first meal following the procedure should be a small meal and then it is ok to progress to your normal diet. Heavy or fried foods are harder to digest and may make you feel nauseous or bloated.  Likewise, meals heavy  in dairy and vegetables can increase bloating.  Drink plenty of fluids but you should avoid alcoholic beverages for 24 hours.  ACTIVITY:  You should plan to take it easy for the rest of today and you should NOT DRIVE or use heavy machinery until tomorrow (because of the sedation medicines used during the test).    FOLLOW UP: Our staff will call the number listed on your records the next business day following your procedure to check on you and address any questions or concerns that you may have regarding the information given to you following your procedure. If we do not reach you, we will leave a message.  However, if you are feeling well and you are not experiencing any problems, there is no need to return our call.  We will assume that you have returned to your regular daily activities without incident.  If any biopsies were taken you will be contacted by phone or by letter within the next 1-3 weeks.  Please call us at [phone] if you have not heard about the biopsies in 3 weeks.    SIGNATURES/CONFIDENTIALITY: You and/or your care partner have signed paperwork which will be entered into your electronic medical record.  These signatures attest to the fact that that the information above on your After Visit Summary has been reviewed and is understood.  Full responsibility of the confidentiality of this discharge information lies with you and/or your care-partner. 

## 2015-12-26 ENCOUNTER — Telehealth: Payer: Self-pay | Admitting: *Deleted

## 2015-12-26 NOTE — Telephone Encounter (Signed)
  Follow up Call-  Call back number 12/25/2015  Post procedure Call Back phone  # [phone]  Permission to leave phone message Yes     Patient questions:  Do you have a fever, pain , or abdominal swelling? No. Pain Score  0 *  Have you tolerated food without any problems? Yes.    Have you been able to return to your normal activities? Yes.    Do you have any questions about your discharge instructions: Diet   No. Medications  No. Follow up visit  No.  Do you have questions or concerns about your Care? No.  Actions: * If pain score is 4 or above: No action needed, pain <4.

## 2016-02-16 ENCOUNTER — Other Ambulatory Visit: Payer: Self-pay | Admitting: Family Medicine

## 2016-02-16 NOTE — Telephone Encounter (Signed)
Her vit. D level is 73?

## 2016-03-12 ENCOUNTER — Other Ambulatory Visit: Payer: PRIVATE HEALTH INSURANCE

## 2016-03-12 DIAGNOSIS — E559 Vitamin D deficiency, unspecified: Secondary | ICD-10-CM

## 2016-03-12 DIAGNOSIS — E785 Hyperlipidemia, unspecified: Secondary | ICD-10-CM

## 2016-03-13 LAB — CBC WITH DIFFERENTIAL/PLATELET
BASOS ABS: 0 10*3/uL (ref 0.0–0.2)
Basos: 0 %
EOS (ABSOLUTE): 0.2 10*3/uL (ref 0.0–0.4)
EOS: 4 %
HEMOGLOBIN: 13.2 g/dL (ref 11.1–15.9)
Hematocrit: 40.3 % (ref 34.0–46.6)
Immature Grans (Abs): 0 10*3/uL (ref 0.0–0.1)
Immature Granulocytes: 0 %
LYMPHS ABS: 1.6 10*3/uL (ref 0.7–3.1)
Lymphs: 29 %
MCH: 29.3 pg (ref 26.6–33.0)
MCHC: 32.8 g/dL (ref 31.5–35.7)
MCV: 90 fL (ref 79–97)
MONOCYTES: 10 %
MONOS ABS: 0.6 10*3/uL (ref 0.1–0.9)
NEUTROS ABS: 3.2 10*3/uL (ref 1.4–7.0)
Neutrophils: 57 %
Platelets: 247 10*3/uL (ref 150–379)
RBC: 4.5 x10E6/uL (ref 3.77–5.28)
RDW: 13.4 % (ref 12.3–15.4)
WBC: 5.6 10*3/uL (ref 3.4–10.8)

## 2016-03-13 LAB — LIPID PANEL
CHOL/HDL RATIO: 1.9 ratio (ref 0.0–4.4)
Cholesterol, Total: 140 mg/dL (ref 100–199)
HDL: 75 mg/dL (ref >39)
LDL Calculated: 55 mg/dL (ref 0–99)
Triglycerides: 52 mg/dL (ref 0–149)
VLDL Cholesterol Cal: 10 mg/dL (ref 5–40)

## 2016-03-13 LAB — BMP8+EGFR
BUN / CREAT RATIO: 30 — AB (ref 12–28)
BUN: 21 mg/dL (ref 8–27)
CHLORIDE: 103 mmol/L (ref 96–106)
CO2: 23 mmol/L (ref 18–29)
Calcium: 9.2 mg/dL (ref 8.7–10.3)
Creatinine, Ser: 0.71 mg/dL (ref 0.57–1.00)
GFR calc non Af Amer: 91 mL/min/{1.73_m2} (ref >59)
GFR, EST AFRICAN AMERICAN: 105 mL/min/{1.73_m2} (ref >59)
GLUCOSE: 84 mg/dL (ref 65–99)
Potassium: 4.5 mmol/L (ref 3.5–5.2)
SODIUM: 143 mmol/L (ref 134–144)

## 2016-03-13 LAB — HEPATIC FUNCTION PANEL
ALK PHOS: 50 IU/L (ref 39–117)
ALT: 19 IU/L (ref 0–32)
AST: 23 IU/L (ref 0–40)
Albumin: 4.7 g/dL (ref 3.6–4.8)
BILIRUBIN TOTAL: 0.8 mg/dL (ref 0.0–1.2)
BILIRUBIN, DIRECT: 0.19 mg/dL (ref 0.00–0.40)
Total Protein: 6.6 g/dL (ref 6.0–8.5)

## 2016-03-13 LAB — VITAMIN D 25 HYDROXY (VIT D DEFICIENCY, FRACTURES): VIT D 25 HYDROXY: 68.1 ng/mL (ref 30.0–100.0)

## 2016-08-06 ENCOUNTER — Ambulatory Visit (INDEPENDENT_AMBULATORY_CARE_PROVIDER_SITE_OTHER): Payer: BLUE CROSS/BLUE SHIELD | Admitting: Family Medicine

## 2016-08-06 ENCOUNTER — Ambulatory Visit (INDEPENDENT_AMBULATORY_CARE_PROVIDER_SITE_OTHER): Payer: BLUE CROSS/BLUE SHIELD

## 2016-08-06 ENCOUNTER — Encounter: Payer: Self-pay | Admitting: Family Medicine

## 2016-08-06 ENCOUNTER — Other Ambulatory Visit: Payer: Self-pay | Admitting: Family Medicine

## 2016-08-06 VITALS — BP 108/58 | HR 80 | Temp 97.6°F | Wt 113.6 lb

## 2016-08-06 DIAGNOSIS — Z13820 Encounter for screening for osteoporosis: Secondary | ICD-10-CM

## 2016-08-06 DIAGNOSIS — Z0000 Encounter for general adult medical examination without abnormal findings: Secondary | ICD-10-CM

## 2016-08-06 DIAGNOSIS — I779 Disorder of arteries and arterioles, unspecified: Secondary | ICD-10-CM

## 2016-08-06 DIAGNOSIS — Z23 Encounter for immunization: Secondary | ICD-10-CM | POA: Diagnosis not present

## 2016-08-06 DIAGNOSIS — Z780 Asymptomatic menopausal state: Secondary | ICD-10-CM | POA: Diagnosis not present

## 2016-08-06 DIAGNOSIS — I739 Peripheral vascular disease, unspecified: Secondary | ICD-10-CM

## 2016-08-06 DIAGNOSIS — N200 Calculus of kidney: Secondary | ICD-10-CM

## 2016-08-06 DIAGNOSIS — E559 Vitamin D deficiency, unspecified: Secondary | ICD-10-CM

## 2016-08-06 DIAGNOSIS — E785 Hyperlipidemia, unspecified: Secondary | ICD-10-CM | POA: Diagnosis not present

## 2016-08-06 DIAGNOSIS — E7800 Pure hypercholesterolemia, unspecified: Secondary | ICD-10-CM

## 2016-08-06 DIAGNOSIS — I6529 Occlusion and stenosis of unspecified carotid artery: Secondary | ICD-10-CM

## 2016-08-06 NOTE — Patient Instructions (Addendum)
Continue current medications. Continue good therapeutic lifestyle changes which include good diet and exercise. Fall precautions discussed with patient. If an FOBT was given today- please return it to our front desk. If you are over 63 years old - you may need Prevnar 13 or the adult Pneumonia vaccine.  **Flu shots are available--- please call and schedule a FLU-CLINIC appointment**  After your visit with us today you will receive a survey in the mail or online from [employer] regarding your care with us. Please take a moment to fill this out. Your feedback is very important to us as you can help us better understand your patient needs as well as improve your experience and satisfaction. WE CARE ABOUT YOU!!!   Reduce her hormones as directed, every other day Call in a couple of months for progress Follow-up with Dr. early as planned for repeat Dopplers of carotids Continue aggressive therapeutic lifestyle changes and rosuvastatin We will call with the results of the lab work and chest x-ray and the DEXA scan with those results become available The flu shot that you received today may make your arm sore

## 2016-08-06 NOTE — Progress Notes (Signed)
Subjective:    Patient ID: Caroline Hodge, female    DOB: 1951-09-24, 66 y.o.   MRN: [id number]  HPI Patient is here today for annual wellness exam and follow up of chronic medical problems which includes: hyperlipidemia. She is taking medications regularly. The patient is doing well overall with no specific complaints. Carotid bruits were heard by one of our providers and she did get an ultrasound back in January and had a 50-69% blockage in the right carotid artery. She is now taking a baby aspirin daily the left had a less than 50% blockage. The patient is on progesterone and Estrace. Her last mammogram was within normal limits. His done in February 2017. The patient denies any chest pain or shortness of breath. She already has an appointment with the vascular specialist to get her Dopplers repeated early next year. She denies any trouble with heartburn indigestion nausea vomiting diarrhea or blood in the stool. General recent colonoscopy and this was good so she will not need to do an FOBT. She is passing her water well. She's been taking estrogen and progesterone for a good while and wishes to come off of this if possible especially because of the expense of the medicine. We discussed her tapering this over a couple of months and then she will call us back depending on how many hot flashes she is having at that time. We could consider trying Effexor if the hot flashes are bad and continue to taper the medicine. We'll wait until she calls [place].   Patient Active Problem List   Diagnosis Date Noted  . Carotid artery disease ([place]) 11/13/2015  . Osteopenia 01/11/2013  . Vitamin D deficiency 01/11/2013   Outpatient Encounter Prescriptions as of 08/06/2016  Medication Sig  . aspirin EC 81 MG tablet Take 1 tablet (81 mg total) by mouth daily.  . ergocalciferol (VITAMIN D2) 50000 units capsule Take 50,000 Units by mouth once a week.  . estradiol (ESTRACE) 0.5 MG tablet Take 1 tablet (0.5 mg total) by  mouth daily.  . progesterone (PROMETRIUM) 100 MG capsule Take 1 capsule (100 mg total) by mouth daily.  . rosuvastatin (CRESTOR) 5 MG tablet Take 1 tablet (5 mg total) by mouth daily.  . Calcium Carbonate-Vitamin D (CALCIUM-VITAMIN D) 500-200 MG-UNIT per tablet Take 1 tablet by mouth daily. Reported on 12/06/2015  . [DISCONTINUED] Vitamin D, Ergocalciferol, (DRISDOL) 50000 units CAPS capsule TAKE ONE CAPSULE BY MOUTH ONCE A WEEK   No facility-administered encounter medications on file as of 08/06/2016.         Review of Systems  Constitutional: Negative.   HENT: Negative.   Eyes: Negative.   Respiratory: Negative.   Cardiovascular: Negative.   Gastrointestinal: Negative.   Endocrine: Negative.   Genitourinary: Negative.   Musculoskeletal: Negative.   Skin: Negative.   Allergic/Immunologic: Negative.   Neurological: Negative.   Hematological: Negative.   Psychiatric/Behavioral: Negative.        Objective:   Physical Exam  Constitutional: She is oriented to person, place, and time. She appears well-developed and well-nourished. No distress.  HENT:  Head: Normocephalic and atraumatic.  Right Ear: External ear normal.  Left Ear: External ear normal.  Mouth/Throat: Oropharynx is clear and moist. No oropharyngeal exudate.  There is nasal congestion bilaterally  Eyes: Conjunctivae and EOM are normal. Pupils are equal, round, and reactive to light. Right eye exhibits no discharge. Left eye exhibits no discharge. No scleral icterus.  Neck: Normal range of motion. Neck supple. No thyromegaly  present.  Bilateral carotid bruits are audible. The patient will follow-up with the vascular surgeon for carotid Dopplers as planned  Cardiovascular: Normal rate, regular rhythm and intact distal pulses.   No murmur heard. Heart has a regular rate and rhythm at 84/m  Pulmonary/Chest: Effort normal and breath sounds normal. No respiratory distress. She has no wheezes. She has no rales. She  exhibits no tenderness.  Clear anteriorly and posteriorly  Abdominal: Soft. Bowel sounds are normal. She exhibits no mass. There is no tenderness. There is no rebound and no guarding.  No abdominal tenderness bruits organ enlargement or masses  Musculoskeletal: Normal range of motion. She exhibits no edema.  Lymphadenopathy:    She has no cervical adenopathy.  Neurological: She is alert and oriented to person, place, and time. She has normal reflexes. No cranial nerve deficit.  Skin: Skin is warm and dry. No rash noted.  Psychiatric: She has a normal mood and affect. Her behavior is normal. Judgment and thought content normal.  Nursing note and vitals reviewed.   BP (!) 108/58 (BP Location: Left Arm)   Pulse 80   Temp 97.6 F (36.4 C) (Oral)   Wt 113 lb 9.6 oz (51.5 kg)   BMI 17.79 kg/m   The patient will get a chest x-ray DEXA scan and lab work today.      Assessment & Plan:  1. Right-sided carotid artery disease ([place]) -The patient has bilateral carotid bruits. She will get repeat Dopplers through the vascular surgeon early next year. She will continue with aggressive therapeutic lifestyle changes and her statin drug. - CBC with Differential/Platelet  2. Pure hypercholesterolemia -Continue with generic Crestor and aggressive therapeutic lifestyle changes - BMP8+EGFR - CBC with Differential/Platelet - Hepatic function panel - NMR, lipoprofile  3. Annual physical exam -She will work on reducing her hormones to every other day for the next couple of months and we will discuss how to move forward from that point in time. - BMP8+EGFR - CBC with Differential/Platelet - Hepatic function panel - VITAMIN D 25 Hydroxy (Vit-D Deficiency, Fractures) - NMR, lipoprofile - DG Chest 2 View; Future - DG [facility] DEXA; Future  4. Vitamin D deficiency -Continue current treatment pending results of lab work - CBC with Differential/Platelet - VITAMIN D 25 Hydroxy (Vit-D Deficiency,  Fractures) - DG [facility] DEXA; Future  5. Screening for osteoporosis -Continue weightbearing exercise fall prevention and calcium and vitamin D - DG [facility] DEXA; Future  6. Post-menopausal -Reduce estrogen and progesterone as directed and call us back in a couple of months for how to reduce it further depending on symptoms at that time - DG [facility] DEXA; Future  7. Nephrolithiasis -The patient is aware of having a kidney stone but has had no symptoms regarding this.  8. Occlusion and stenosis of carotid artery -Follow-up with vascular surgeon as planned, Dr. Sherren Mocha early  Patient Instructions  Continue current medications. Continue good therapeutic lifestyle changes which include good diet and exercise. Fall precautions discussed with patient. If an FOBT was given today- please return it to our front desk. If you are over 34 years old - you may need Prevnar 69 or the adult Pneumonia vaccine.  **Flu shots are available--- please call and schedule a FLU-CLINIC appointment**  After your visit with [place] today you will receive a survey in the mail or online from [employer] regarding your care with [place]. Please take a moment to fill this out. Your feedback is very important to [place] as you  can help [place] better understand your patient needs as well as improve your experience and satisfaction. WE CARE ABOUT YOU!!!   Reduce her hormones as directed, every other day Call in a couple of months for progress Follow-up with Dr. early as planned for repeat Dopplers of carotids Continue aggressive therapeutic lifestyle changes and rosuvastatin We will call with the results of the lab work and chest x-ray and the DEXA scan with those results become available The flu shot that you received today may make your arm sore    Arrie Senate MD

## 2016-08-07 LAB — CBC WITH DIFFERENTIAL/PLATELET
BASOS ABS: 0 10*3/uL (ref 0.0–0.2)
BASOS: 0 %
EOS (ABSOLUTE): 0.1 10*3/uL (ref 0.0–0.4)
Eos: 2 %
Hematocrit: 38.3 % (ref 34.0–46.6)
Hemoglobin: 12.8 g/dL (ref 11.1–15.9)
IMMATURE GRANS (ABS): 0 10*3/uL (ref 0.0–0.1)
IMMATURE GRANULOCYTES: 0 %
LYMPHS: 23 %
Lymphocytes Absolute: 1.3 10*3/uL (ref 0.7–3.1)
MCH: 29.6 pg (ref 26.6–33.0)
MCHC: 33.4 g/dL (ref 31.5–35.7)
MCV: 89 fL (ref 79–97)
Monocytes Absolute: 0.6 10*3/uL (ref 0.1–0.9)
Monocytes: 11 %
NEUTROS PCT: 64 %
Neutrophils Absolute: 3.7 10*3/uL (ref 1.4–7.0)
PLATELETS: 230 10*3/uL (ref 150–379)
RBC: 4.33 x10E6/uL (ref 3.77–5.28)
RDW: 13.8 % (ref 12.3–15.4)
WBC: 5.7 10*3/uL (ref 3.4–10.8)

## 2016-08-07 LAB — BMP8+EGFR
BUN/Creatinine Ratio: 24 (ref 12–28)
BUN: 16 mg/dL (ref 8–27)
CALCIUM: 8.8 mg/dL (ref 8.7–10.3)
CHLORIDE: 104 mmol/L (ref 96–106)
CO2: 25 mmol/L (ref 18–29)
Creatinine, Ser: 0.66 mg/dL (ref 0.57–1.00)
GFR calc Af Amer: 109 mL/min/{1.73_m2} (ref >59)
GFR, EST NON AFRICAN AMERICAN: 94 mL/min/{1.73_m2} (ref >59)
Glucose: 82 mg/dL (ref 65–99)
POTASSIUM: 4.2 mmol/L (ref 3.5–5.2)
Sodium: 143 mmol/L (ref 134–144)

## 2016-08-07 LAB — NMR, LIPOPROFILE
Cholesterol: 127 mg/dL (ref 100–199)
HDL CHOLESTEROL BY NMR: 68 mg/dL (ref >39)
HDL PARTICLE NUMBER: 37.1 umol/L (ref >=30.5)
LDL Particle Number: 303 nmol/L (ref <1000)
LDL-C: 48 mg/dL (ref 0–99)
LP-IR Score: <25 (ref <=45)
Small LDL Particle Number: <90 nmol/L (ref <=527)
TRIGLYCERIDES BY NMR: 55 mg/dL (ref 0–149)

## 2016-08-07 LAB — HEPATIC FUNCTION PANEL
ALT: 11 IU/L (ref 0–32)
AST: 16 IU/L (ref 0–40)
Albumin: 4.5 g/dL (ref 3.6–4.8)
Alkaline Phosphatase: 43 IU/L (ref 39–117)
BILIRUBIN TOTAL: 1.3 mg/dL — AB (ref 0.0–1.2)
BILIRUBIN, DIRECT: 0.33 mg/dL (ref 0.00–0.40)
TOTAL PROTEIN: 6.4 g/dL (ref 6.0–8.5)

## 2016-08-07 LAB — VITAMIN D 25 HYDROXY (VIT D DEFICIENCY, FRACTURES): Vit D, 25-Hydroxy: 42.2 ng/mL (ref 30.0–100.0)

## 2016-10-28 ENCOUNTER — Ambulatory Visit (INDEPENDENT_AMBULATORY_CARE_PROVIDER_SITE_OTHER): Payer: BLUE CROSS/BLUE SHIELD | Admitting: Nurse Practitioner

## 2016-10-28 ENCOUNTER — Encounter: Payer: Self-pay | Admitting: Nurse Practitioner

## 2016-10-28 VITALS — BP 120/64 | HR 90 | Temp 96.8°F | Ht 67.0 in | Wt 114.0 lb

## 2016-10-28 DIAGNOSIS — Z01419 Encounter for gynecological examination (general) (routine) without abnormal findings: Secondary | ICD-10-CM

## 2016-10-28 LAB — MICROSCOPIC EXAMINATION: Renal Epithel, UA: NONE SEEN /hpf

## 2016-10-28 LAB — URINALYSIS, COMPLETE
Bilirubin, UA: NEGATIVE
Glucose, UA: NEGATIVE
Nitrite, UA: NEGATIVE
PROTEIN UA: NEGATIVE
SPEC GRAV UA: 1.02 (ref 1.005–1.030)
Urobilinogen, Ur: 0.2 mg/dL (ref 0.2–1.0)
pH, UA: 5.5 (ref 5.0–7.5)

## 2016-10-28 NOTE — Progress Notes (Signed)
   Subjective:    Patient ID: Caroline Hodge, female    DOB: 09-22-1951, 66 y.o.   MRN: [id number]  HPI  Mrs. Caroline Hodge is a regular patient of Dr. Christell ConstantMoore that comes in today for Pap and breast only- She last saw Dr. Christell ConstantMoore for physical exam on 08/06/16. SHe is doing well today without complaints.  Outpatient Encounter Prescriptions as of 10/28/2016  Medication Sig  . aspirin EC 81 MG tablet Take 1 tablet (81 mg total) by mouth daily.  . Calcium Carbonate-Vitamin D (CALCIUM-VITAMIN D) 500-200 MG-UNIT per tablet Take 1 tablet by mouth daily. Reported on 12/06/2015  . ergocalciferol (VITAMIN D2) 50000 units capsule Take 50,000 Units by mouth once a week.  . estradiol (ESTRACE) 0.5 MG tablet Take 1 tablet (0.5 mg total) by mouth daily.  . progesterone (PROMETRIUM) 100 MG capsule TAKE ONE CAPSULE BY MOUTH ONCE DAILY  . rosuvastatin (CRESTOR) 5 MG tablet Take 1 tablet (5 mg total) by mouth daily.   No facility-administered encounter medications on file as of 10/28/2016.        Review of Systems  Constitutional: Negative.   HENT: Negative.   Respiratory: Negative.   Cardiovascular: Negative.   Gastrointestinal: Negative.   Genitourinary: Negative.   Neurological: Negative.   Psychiatric/Behavioral: Negative.   All other systems reviewed and are negative.      Objective:   Physical Exam  Constitutional: She is oriented to person, place, and time. She appears well-developed and well-nourished. No distress.  HENT:  Head: Normocephalic.  Right Ear: Hearing, tympanic membrane, external ear and ear canal normal.  Left Ear: Hearing, tympanic membrane, external ear and ear canal normal.  Nose: Nose normal.  Mouth/Throat: Uvula is midline and oropharynx is clear and moist.  Eyes: Conjunctivae and EOM are normal. Pupils are equal, round, and reactive to light.  Neck: Normal range of motion and full passive range of motion without pain. Neck supple. No JVD present. Carotid bruit is not present.  No thyroid mass and no thyromegaly present.  Cardiovascular: Normal rate, normal heart sounds and intact distal pulses.   No murmur heard. Pulmonary/Chest: Effort normal and breath sounds normal. Right breast exhibits no inverted nipple, no mass, no nipple discharge, no skin change and no tenderness. Left breast exhibits no inverted nipple, no mass, no nipple discharge, no skin change and no tenderness.  Abdominal: Soft. Bowel sounds are normal. She exhibits no mass. There is no tenderness.  Genitourinary: Vagina normal and uterus normal. No breast swelling, tenderness, discharge or bleeding. No vaginal discharge found.  Genitourinary Comments: bimanual exam-No adnexal masses or tenderness. Cervix parous and pink -  Musculoskeletal: Normal range of motion.  Lymphadenopathy:    She has no cervical adenopathy.  Neurological: She is alert and oriented to person, place, and time.  Skin: Skin is warm and dry.  Psychiatric: She has a normal mood and affect. Her behavior is normal. Judgment and thought content normal.   BP 120/64   Pulse 90   Temp (!) 96.8 F (36 C) (Oral)   Ht 5\' 7"  (1.702 m)   Wt 114 lb (51.7 kg)   BMI 17.85 kg/m      Assessment & Plan:   1. Encounter for gynecological examination without abnormal finding    Keep follow up appointment with Dr. Cristela FeltMoore  Caroline Tamim Skog, FNP

## 2016-10-28 NOTE — Patient Instructions (Signed)

## 2016-10-30 LAB — IGP, APTIMA HPV, RFX 16/18,45
HPV APTIMA: NEGATIVE
PAP SMEAR COMMENT: 0

## 2016-11-07 ENCOUNTER — Other Ambulatory Visit: Payer: Self-pay | Admitting: Family Medicine

## 2016-11-18 ENCOUNTER — Other Ambulatory Visit: Payer: Self-pay | Admitting: Family Medicine

## 2016-11-19 ENCOUNTER — Encounter: Payer: Self-pay | Admitting: Vascular Surgery

## 2016-11-26 ENCOUNTER — Encounter: Payer: Self-pay | Admitting: Vascular Surgery

## 2016-11-26 ENCOUNTER — Ambulatory Visit (INDEPENDENT_AMBULATORY_CARE_PROVIDER_SITE_OTHER): Payer: BLUE CROSS/BLUE SHIELD | Admitting: Vascular Surgery

## 2016-11-26 ENCOUNTER — Ambulatory Visit (HOSPITAL_COMMUNITY)
Admission: RE | Admit: 2016-11-26 | Discharge: 2016-11-26 | Disposition: A | Discharge status: Home / Self Care | Payer: BLUE CROSS/BLUE SHIELD | Source: Ambulatory Visit | Attending: Vascular Surgery | Admitting: Vascular Surgery

## 2016-11-26 VITALS — BP 121/70 | HR 80 | Temp 98.1°F | Resp 18 | Ht 67.0 in | Wt 108.2 lb

## 2016-11-26 DIAGNOSIS — I6521 Occlusion and stenosis of right carotid artery: Secondary | ICD-10-CM | POA: Diagnosis present

## 2016-11-26 DIAGNOSIS — I6523 Occlusion and stenosis of bilateral carotid arteries: Secondary | ICD-10-CM | POA: Diagnosis not present

## 2016-11-26 LAB — VAS US CAROTID
LCCADDIAS: 31 cm/s
LEFT ECA DIAS: -21 cm/s
LEFT VERTEBRAL DIAS: 28 cm/s
LICADDIAS: -57 cm/s
LICAPDIAS: -42 cm/s
LICAPSYS: -104 cm/s
Left CCA dist sys: 104 cm/s
Left CCA prox dias: 38 cm/s
Left CCA prox sys: 132 cm/s
Left ICA dist sys: -151 cm/s
RIGHT CCA MID DIAS: 36 cm/s
RIGHT ECA DIAS: -14 cm/s
RIGHT VERTEBRAL DIAS: 24 cm/s
Right CCA prox dias: 28 cm/s
Right CCA prox sys: 118 cm/s
Right cca dist sys: -156 cm/s

## 2016-11-26 NOTE — Progress Notes (Signed)
Vascular and Vein Specialist of [facility]  Patient name: Caroline Hodge MRN: [id number] DOB: [birth year] Sex: female  REASON FOR VISIT: Follow-up of asymptomatic carotid stenosis  HPI: Caroline Hodge is a 66 y.o. female here today for follow-up. She denies any new neurologic deficits. Specifically no amaurosis fugax, transient ischemic attack or stroke. He remains very healthy.  Past Medical History:  Diagnosis Date  . Carotid artery occlusion   . Osteopenia   . Personal history of kidney stones 2007  . Vitamin D deficiency     Family History  Problem Relation Age of Onset  . Healthy Mother   . Healthy Father   . Colon cancer Neg Hx     SOCIAL HISTORY: Social History  Substance Use Topics  . Smoking status: Never Smoker  . Smokeless tobacco: Never Used  . Alcohol use 1.2 oz/week    1 Glasses of wine, 1 Standard drinks or equivalent per week    No Known Allergies  Current Outpatient Prescriptions  Medication Sig Dispense Refill  . aspirin EC 81 MG tablet Take 1 tablet (81 mg total) by mouth daily. 100 tablet 11  . Calcium Carbonate-Vitamin D (CALCIUM-VITAMIN D) 500-200 MG-UNIT per tablet Take 1 tablet by mouth daily. Reported on 12/06/2015    . estradiol (ESTRACE) 0.5 MG tablet Take 1 tablet (0.5 mg total) by mouth daily. 90 tablet 3  . progesterone (PROMETRIUM) 100 MG capsule TAKE ONE CAPSULE BY MOUTH ONCE DAILY 90 capsule 1  . rosuvastatin (CRESTOR) 5 MG tablet TAKE ONE TABLET BY MOUTH ONCE DAILY 90 tablet 0  . Vitamin D, Ergocalciferol, (DRISDOL) 50000 units CAPS capsule TAKE ONE CAPSULE BY MOUTH ONCE A WEEK 12 capsule 0   No current facility-administered medications for this visit.     REVIEW OF SYSTEMS:  [X]  denotes positive finding, [ ]  denotes negative finding Cardiac  Comments:  Chest pain or chest pressure:    Shortness of breath upon exertion:    Short of breath when lying flat:    Irregular heart rhythm:          Vascular    Pain in calf, thigh, or hip brought on by ambulation:    Pain in feet at night that wakes you up from your sleep:     Blood clot in your veins:    Leg swelling:           PHYSICAL EXAM: Vitals:   11/26/16 1026 11/26/16 1028  BP: (!) 123/59 121/70  Pulse: 80   Resp: 18   Temp: 98.1 F (36.7 C)   TempSrc: Oral   SpO2: 100%   Weight: 108 lb 3.2 oz (49.1 kg)   Height: 5\' 7"  (1.702 m)     GENERAL: The patient is a well-nourished female, in no acute distress. The vital signs are documented above. CARDIOVASCULAR: Soft right carotid bruit. I do not appreciate bruit on the left PULMONARY: There is good air exchange  MUSCULOSKELETAL: There are no major deformities or cyanosis. NEUROLOGIC: No focal weakness or paresthesias are detected. SKIN: There are no ulcers or rashes noted. PSYCHIATRIC: The patient has a normal affect.  DATA:  Carotid duplex today reveals moderate 40-59% stenosis by velocity in both carotid arteries. She has minimal plaque present.  MEDICAL ISSUES: Stable moderate carotid stenosis bilaterally. Of I discussed the fact this may be related to fibromuscular dysplasia rather than typical atherosclerotic stenosis. Explained that we would not make any changes in her treatment related to this. Recommend continued yearly carotid  duplex follow-up to rule out progression of her stenosis. She knows to notify us immediately should she develop any neurologic deficits. Otherwise we'll see her again in one year for continued follow-up    Larina Earthlyodd F. Lamira Borin, MD [facility] Office Tel [phone] Pager [phone]

## 2016-12-03 ENCOUNTER — Other Ambulatory Visit: Payer: Self-pay | Admitting: Family Medicine

## 2016-12-04 NOTE — Addendum Note (Signed)
Addended by: Burton ApleyPETTY, Ruger Saxer A on: 12/04/2016 04:37 PM   Modules accepted: Orders

## 2017-02-06 ENCOUNTER — Other Ambulatory Visit: Payer: Self-pay | Admitting: Family Medicine

## 2017-02-25 ENCOUNTER — Other Ambulatory Visit: Payer: Self-pay | Admitting: Family Medicine

## 2017-03-12 ENCOUNTER — Encounter: Payer: BLUE CROSS/BLUE SHIELD | Admitting: *Deleted

## 2017-05-05 ENCOUNTER — Other Ambulatory Visit: Payer: Self-pay | Admitting: Family Medicine

## 2017-06-03 ENCOUNTER — Other Ambulatory Visit: Payer: Self-pay | Admitting: Family Medicine

## 2017-06-30 ENCOUNTER — Other Ambulatory Visit: Payer: Self-pay | Admitting: Family Medicine

## 2017-07-04 ENCOUNTER — Encounter: Payer: Self-pay | Admitting: *Deleted

## 2017-07-28 ENCOUNTER — Ambulatory Visit (INDEPENDENT_AMBULATORY_CARE_PROVIDER_SITE_OTHER): Payer: BLUE CROSS/BLUE SHIELD | Admitting: *Deleted

## 2017-07-28 DIAGNOSIS — Z23 Encounter for immunization: Secondary | ICD-10-CM | POA: Diagnosis not present

## 2017-08-13 ENCOUNTER — Ambulatory Visit: Payer: BLUE CROSS/BLUE SHIELD | Admitting: Family Medicine

## 2017-08-18 ENCOUNTER — Ambulatory Visit: Payer: BLUE CROSS/BLUE SHIELD | Admitting: Family Medicine

## 2017-09-01 ENCOUNTER — Ambulatory Visit (INDEPENDENT_AMBULATORY_CARE_PROVIDER_SITE_OTHER): Payer: BLUE CROSS/BLUE SHIELD | Admitting: Family Medicine

## 2017-09-01 ENCOUNTER — Encounter: Payer: Self-pay | Admitting: Family Medicine

## 2017-09-01 VITALS — BP 123/63 | HR 83 | Temp 97.5°F | Ht 67.0 in | Wt 109.0 lb

## 2017-09-01 DIAGNOSIS — E559 Vitamin D deficiency, unspecified: Secondary | ICD-10-CM

## 2017-09-01 DIAGNOSIS — Z0000 Encounter for general adult medical examination without abnormal findings: Secondary | ICD-10-CM

## 2017-09-01 DIAGNOSIS — E7800 Pure hypercholesterolemia, unspecified: Secondary | ICD-10-CM

## 2017-09-01 NOTE — Progress Notes (Signed)
Subjective:    Patient ID: Caroline Hodge, female    DOB: [birth year], 66 y.o.   MRN: [id number]  HPI Patient is here today for annual wellness exam and follow up of chronic medical problems which includes hyperlipidemia. She is taking medication regularly.  The patient is doing well and overall has no complaints.  She does not need any medicines refilled.  She has a history of carotid artery disease.  She will be given FOBT to return and will come back to the office fasting for lab work.  Her vital signs are stable and her weight is up 1 pound since the last visit.  Patient is up-to-date on her mammograms and pelvic exams.  She denies any chest pain or shortness of breath.  She denies any trouble or change in bowel habits or with her bowels moving.  She denies any nausea vomiting diarrhea constipation or blood in the stool.  Her last colonoscopy was last year and was normal.  There is no family history of colon cancer.  She is passing her water without problems.  She spends a lot of time taking care of her elderly mother who is recently had a laminectomy and making sure that she is doing well.  She does have this problem with carotid artery disease and is taking Crestor for this.  She is also taking a coated baby aspirin.  She stopped her vitamin D a couple weeks ago so we do the level on of the a little bit lower than we would like but we will wait for that result before we make any other changes.     Patient Active Problem List   Diagnosis Date Noted  . Carotid artery disease ([place]) 11/13/2015  . Osteopenia 01/11/2013  . Vitamin D deficiency 01/11/2013   Outpatient Encounter Medications as of 09/01/2017  Medication Sig  . aspirin EC 81 MG tablet Take 1 tablet (81 mg total) by mouth daily.  . Calcium Carbonate-Vitamin D (CALCIUM-VITAMIN D) 500-200 MG-UNIT per tablet Take 1 tablet by mouth daily. Reported on 12/06/2015  . estradiol (ESTRACE) 0.5 MG tablet TAKE ONE TABLET BY MOUTH ONCE DAILY  .  progesterone (PROMETRIUM) 100 MG capsule TAKE ONE CAPSULE BY MOUTH ONCE DAILY  . rosuvastatin (CRESTOR) 5 MG tablet TAKE 1 TABLET BY MOUTH ONCE DAILY  . Vitamin D, Ergocalciferol, (DRISDOL) 50000 units CAPS capsule TAKE 1 CAPSULE BY MOUTH ONCE A WEEK (Patient not taking: Reported on 09/01/2017)   No facility-administered encounter medications on file as of 09/01/2017.       Review of Systems  Constitutional: Negative.   HENT: Negative.   Eyes: Negative.   Respiratory: Negative.   Cardiovascular: Negative.   Gastrointestinal: Negative.   Endocrine: Negative.   Genitourinary: Negative.   Musculoskeletal: Negative.   Skin: Negative.   Allergic/Immunologic: Negative.   Neurological: Negative.   Hematological: Negative.   Psychiatric/Behavioral: Negative.        Objective:   Physical Exam  Constitutional: She is oriented to person, place, and time. She appears well-developed and well-nourished. No distress.  The patient is pleasant alert and relaxed.  HENT:  Head: Normocephalic and atraumatic.  Right Ear: External ear normal.  Left Ear: External ear normal.  Nose: Nose normal.  Mouth/Throat: Oropharynx is clear and moist.  Eyes: Conjunctivae and EOM are normal. Pupils are equal, round, and reactive to light. Right eye exhibits no discharge. Left eye exhibits no discharge. No scleral icterus.  Neck: Normal range of motion. Neck supple. No thyromegaly  present.  No bruits were audible today on either side.  Cardiovascular: Normal rate, regular rhythm, normal heart sounds and intact distal pulses.  No murmur heard. Heart had a regular rate and rhythm at 96/min.  Pulmonary/Chest: Effort normal and breath sounds normal. No respiratory distress. She has no wheezes. She has no rales.  Clear anteriorly and posteriorly  Abdominal: Soft. Bowel sounds are normal. She exhibits no mass. There is no tenderness. There is no rebound and no guarding.  No liver or spleen enlargement.  No  epigastric tenderness.  No inguinal adenopathy.  No masses.  Musculoskeletal: Normal range of motion. She exhibits no edema.  Lymphadenopathy:    She has no cervical adenopathy.  Neurological: She is alert and oriented to person, place, and time. She has normal reflexes. No cranial nerve deficit.  Skin: Skin is warm and dry. No rash noted.  Psychiatric: She has a normal mood and affect. Her behavior is normal. Judgment and thought content normal.  Nursing note and vitals reviewed.  BP 123/63 (BP Location: Left Arm)   Pulse 83   Temp (!) 97.5 F (36.4 C) (Oral)   Ht _0  (1.702 m)   Wt 109 lb (49.4 kg)   BMI 17.07 kg/m         Assessment & Plan:  1. Annual physical exam -Continue with current treatment and wait for lab work to be returned to decide on the vitamin D replacement. - Thyroid Panel With TSH; Future - BMP8+EGFR; Future - Lipid panel; Future - CBC with Differential/Platelet; Future - Hepatic function panel; Future - VITAMIN D 25 Hydroxy (Vit-D Deficiency, Fractures); Future  2. Pure hypercholesterolemia -Because of the carotid bruit, continue with Crestor 5 mg and follow-up with vascular surgeon as directed for repeat Dopplers in February - Lipid panel; Future - CBC with Differential/Platelet; Future - Hepatic function panel; Future  3. Vitamin D deficiency -The patient has been off the 50,000 units of vitamin D for 2-3 weeks.  We will not have her restart this until the lab work is returned and she may need to restart it pending those results - CBC with Differential/Platelet; Future - VITAMIN D 25 Hydroxy (Vit-D Deficiency, Fractures); Future  Patient Instructions  Continue current medications. Continue good therapeutic lifestyle changes which include good diet and exercise. Fall precautions discussed with patient. If an FOBT was given today- please return it to our front desk. If you are over 4 years old - you may need Prevnar 70 or the adult Pneumonia  vaccine.  **Flu shots are available--- please call and schedule a FLU-CLINIC appointment**  After your visit with [place] today you will receive a survey in the mail or online from [employer] regarding your care with [place]. Please take a moment to fill this out. Your feedback is very important to [place] as you can help [place] better understand your patient needs as well as improve your experience and satisfaction. WE CARE ABOUT YOU!!!   Keep appt with DR Early Continue to be careful do not put yourself at risk for falling Continue with aggressive therapeutic lifestyle changes and Crestor We will restart the vitamin D based on the that has yet to be done.  Arrie Senate MD

## 2017-09-01 NOTE — Patient Instructions (Addendum)
Continue current medications. Continue good therapeutic lifestyle changes which include good diet and exercise. Fall precautions discussed with patient. If an FOBT was given today- please return it to our front desk. If you are over 64 years old - you may need Prevnar 13 or the adult Pneumonia vaccine.  **Flu shots are available--- please call and schedule a FLU-CLINIC appointment**  After your visit with us today you will receive a survey in the mail or online from [employer] regarding your care with us. Please take a moment to fill this out. Your feedback is very important to us as you can help us better understand your patient needs as well as improve your experience and satisfaction. WE CARE ABOUT YOU!!!   Keep appt with DR Early Continue to be careful do not put yourself at risk for falling Continue with aggressive therapeutic lifestyle changes and Crestor We will restart the vitamin D based on the that has yet to be done.

## 2017-09-10 ENCOUNTER — Other Ambulatory Visit: Payer: Self-pay | Admitting: Family Medicine

## 2017-09-11 ENCOUNTER — Other Ambulatory Visit: Payer: BLUE CROSS/BLUE SHIELD

## 2017-09-11 DIAGNOSIS — Z0000 Encounter for general adult medical examination without abnormal findings: Secondary | ICD-10-CM

## 2017-09-11 DIAGNOSIS — E7800 Pure hypercholesterolemia, unspecified: Secondary | ICD-10-CM

## 2017-09-11 DIAGNOSIS — E559 Vitamin D deficiency, unspecified: Secondary | ICD-10-CM

## 2017-09-12 LAB — BMP8+EGFR
BUN/Creatinine Ratio: 16 (ref 12–28)
BUN: 12 mg/dL (ref 8–27)
CALCIUM: 9.3 mg/dL (ref 8.7–10.3)
CHLORIDE: 104 mmol/L (ref 96–106)
CO2: 25 mmol/L (ref 20–29)
Creatinine, Ser: 0.77 mg/dL (ref 0.57–1.00)
GFR calc Af Amer: 94 mL/min/{1.73_m2} (ref >59)
GFR, EST NON AFRICAN AMERICAN: 82 mL/min/{1.73_m2} (ref >59)
Glucose: 89 mg/dL (ref 65–99)
POTASSIUM: 4.1 mmol/L (ref 3.5–5.2)
Sodium: 143 mmol/L (ref 134–144)

## 2017-09-12 LAB — HEPATIC FUNCTION PANEL
ALBUMIN: 4.4 g/dL (ref 3.6–4.8)
ALT: 12 IU/L (ref 0–32)
AST: 19 IU/L (ref 0–40)
Alkaline Phosphatase: 46 IU/L (ref 39–117)
BILIRUBIN TOTAL: 1.1 mg/dL (ref 0.0–1.2)
Bilirubin, Direct: 0.32 mg/dL (ref 0.00–0.40)
Total Protein: 6.4 g/dL (ref 6.0–8.5)

## 2017-09-12 LAB — CBC WITH DIFFERENTIAL/PLATELET
BASOS: 0 %
Basophils Absolute: 0 10*3/uL (ref 0.0–0.2)
EOS (ABSOLUTE): 0.2 10*3/uL (ref 0.0–0.4)
EOS: 4 %
HEMATOCRIT: 42.1 % (ref 34.0–46.6)
Hemoglobin: 13.6 g/dL (ref 11.1–15.9)
Immature Grans (Abs): 0 10*3/uL (ref 0.0–0.1)
Immature Granulocytes: 0 %
LYMPHS ABS: 1.6 10*3/uL (ref 0.7–3.1)
Lymphs: 28 %
MCH: 29.4 pg (ref 26.6–33.0)
MCHC: 32.3 g/dL (ref 31.5–35.7)
MCV: 91 fL (ref 79–97)
MONOS ABS: 0.5 10*3/uL (ref 0.1–0.9)
Monocytes: 8 %
NEUTROS PCT: 60 %
Neutrophils Absolute: 3.6 10*3/uL (ref 1.4–7.0)
PLATELETS: 286 10*3/uL (ref 150–379)
RBC: 4.62 x10E6/uL (ref 3.77–5.28)
RDW: 13.7 % (ref 12.3–15.4)
WBC: 5.9 10*3/uL (ref 3.4–10.8)

## 2017-09-12 LAB — THYROID PANEL WITH TSH
FREE THYROXINE INDEX: 1.5 (ref 1.2–4.9)
T3 UPTAKE RATIO: 22 % — AB (ref 24–39)
T4 TOTAL: 7 ug/dL (ref 4.5–12.0)
TSH: 2.38 u[IU]/mL (ref 0.450–4.500)

## 2017-09-12 LAB — LIPID PANEL
CHOL/HDL RATIO: 1.9 ratio (ref 0.0–4.4)
Cholesterol, Total: 125 mg/dL (ref 100–199)
HDL: 67 mg/dL (ref >39)
LDL Calculated: 48 mg/dL (ref 0–99)
Triglycerides: 50 mg/dL (ref 0–149)
VLDL Cholesterol Cal: 10 mg/dL (ref 5–40)

## 2017-09-12 LAB — VITAMIN D 25 HYDROXY (VIT D DEFICIENCY, FRACTURES): VIT D 25 HYDROXY: 66.7 ng/mL (ref 30.0–100.0)

## 2017-12-02 ENCOUNTER — Ambulatory Visit (HOSPITAL_COMMUNITY)
Admission: RE | Admit: 2017-12-02 | Discharge: 2017-12-02 | Disposition: A | Discharge status: Home / Self Care | Payer: Medicare Other | Source: Ambulatory Visit | Attending: Vascular Surgery | Admitting: Vascular Surgery

## 2017-12-02 ENCOUNTER — Encounter: Payer: Self-pay | Admitting: Vascular Surgery

## 2017-12-02 ENCOUNTER — Ambulatory Visit (INDEPENDENT_AMBULATORY_CARE_PROVIDER_SITE_OTHER): Payer: Medicare Other | Admitting: Vascular Surgery

## 2017-12-02 ENCOUNTER — Other Ambulatory Visit: Payer: Self-pay

## 2017-12-02 VITALS — BP 124/69 | HR 81 | Temp 97.6°F | Resp 16 | Ht 67.0 in | Wt 108.9 lb

## 2017-12-02 DIAGNOSIS — I6523 Occlusion and stenosis of bilateral carotid arteries: Secondary | ICD-10-CM | POA: Insufficient documentation

## 2017-12-02 LAB — VAS US CAROTID
LCCADDIAS: -39 cm/s
LCCADSYS: -117 cm/s
LCCAPSYS: 143 cm/s
LEFT ECA DIAS: -25 cm/s
LEFT VERTEBRAL DIAS: -30 cm/s
LICADDIAS: -64 cm/s
LICADSYS: -193 cm/s
Left CCA prox dias: 34 cm/s
Left ICA prox dias: -36 cm/s
Left ICA prox sys: -120 cm/s
RCCADSYS: -189 cm/s
RCCAPSYS: 116 cm/s
RIGHT CCA MID DIAS: -42 cm/s
RIGHT ECA DIAS: -18 cm/s
RIGHT VERTEBRAL DIAS: -32 cm/s
Right CCA prox dias: 30 cm/s

## 2017-12-02 NOTE — Progress Notes (Signed)
Vascular and Vein Specialist of [facility]  Patient name: Caroline Hodge MRN: [id number] DOB: 05-07-1951 Sex: female  REASON FOR VISIT: Follow-up bilateral carotid stenosis, asymptomatic  HPI: Caroline Hodge is a 67 y.o. female here today for follow-up.  She has known moderate bilateral carotid stenosis.  She is here today for duplex follow-up.  She has remained completely asymptomatic with no episodes of amaurosis fugax, transient ischemic attack or stroke.  She has no history of cardiac disease.  She is on daily aspirin therapy.  She is a non-smoker.  Past Medical History:  Diagnosis Date  . Carotid artery occlusion   . Osteopenia   . Personal history of kidney stones 2007  . Vitamin D deficiency     Family History  Problem Relation Age of Onset  . Healthy Mother   . Healthy Father   . Colon cancer Neg Hx     SOCIAL HISTORY: Social History   Tobacco Use  . Smoking status: Never Smoker  . Smokeless tobacco: Never Used  Substance Use Topics  . Alcohol use: Yes    Alcohol/week: 1.2 oz    Types: 1 Glasses of wine, 1 Standard drinks or equivalent per week    No Known Allergies  Current Outpatient Medications  Medication Sig Dispense Refill  . aspirin EC 81 MG tablet Take 1 tablet (81 mg total) by mouth daily. 100 tablet 11  . Calcium Carbonate-Vitamin D (CALCIUM-VITAMIN D) 500-200 MG-UNIT per tablet Take 1 tablet by mouth daily. Reported on 12/06/2015    . estradiol (ESTRACE) 0.5 MG tablet TAKE ONE TABLET BY MOUTH ONCE DAILY 90 tablet 3  . progesterone (PROMETRIUM) 100 MG capsule TAKE ONE CAPSULE BY MOUTH ONCE DAILY 90 capsule 0  . rosuvastatin (CRESTOR) 5 MG tablet TAKE 1 TABLET BY MOUTH ONCE DAILY 90 tablet 1  . Vitamin D, Ergocalciferol, (DRISDOL) 50000 units CAPS capsule TAKE 1 CAPSULE BY MOUTH ONCE A WEEK 12 capsule 0   No current facility-administered medications for this visit.     REVIEW OF SYSTEMS:  [X]  denotes positive  finding, [ ]  denotes negative finding Cardiac  Comments:  Chest pain or chest pressure:    Shortness of breath upon exertion:    Short of breath when lying flat:    Irregular heart rhythm:        Vascular    Pain in calf, thigh, or hip brought on by ambulation:    Pain in feet at night that wakes you up from your sleep:     Blood clot in your veins:    Leg swelling:           PHYSICAL EXAM: Vitals:   12/02/17 1051 12/02/17 1053  BP: 123/62 124/69  Pulse: 81   Resp: 16   Temp: 97.6 F (36.4 C)   TempSrc: Oral   SpO2: 100%   Weight: 108 lb 14.4 oz (49.4 kg)   Height: 5\' 7"  (1.702 m)     GENERAL: The patient is a well-nourished female, in no acute distress. The vital signs are documented above. CARDIOVASCULAR: She does have soft carotid bruits bilaterally.  I do not heart murmur. PULMONARY: There is good air exchange  MUSCULOSKELETAL: There are no major deformities or cyanosis. NEUROLOGIC: No focal weakness or paresthesias are detected. SKIN: There are no ulcers or rashes noted. PSYCHIATRIC: The patient has a normal affect.  DATA:  Carotid duplex today reveals moderate stenoses bilaterally.  Her degree of narrowing is slightly higher on the right than it  was on her last study a year ago no significant change in the left carotid.  MEDICAL ISSUES: Asymptomatic moderate carotid disease.  Again discussed symptoms disease with her and she will notify us immediately should this occur.  Otherwise we will see her again in 1 year with repeat carotid duplex evaluation.    Larina Earthlyodd F. Heydi Swango, MD FACS [facility] Office Tel [phone] Pager [phone]

## 2017-12-11 ENCOUNTER — Other Ambulatory Visit: Payer: Self-pay | Admitting: Nurse Practitioner

## 2018-03-17 ENCOUNTER — Other Ambulatory Visit: Payer: Self-pay | Admitting: Family Medicine

## 2018-03-17 NOTE — Telephone Encounter (Signed)
Nov 2018 OV note rck [id number]

## 2018-05-06 ENCOUNTER — Other Ambulatory Visit: Payer: Self-pay | Admitting: Nurse Practitioner

## 2018-08-12 ENCOUNTER — Ambulatory Visit (INDEPENDENT_AMBULATORY_CARE_PROVIDER_SITE_OTHER): Payer: Medicare Other

## 2018-08-12 DIAGNOSIS — Z23 Encounter for immunization: Secondary | ICD-10-CM

## 2018-09-02 ENCOUNTER — Other Ambulatory Visit: Payer: Medicare Other

## 2018-09-02 ENCOUNTER — Encounter: Payer: Self-pay | Admitting: Family Medicine

## 2018-09-02 ENCOUNTER — Ambulatory Visit (INDEPENDENT_AMBULATORY_CARE_PROVIDER_SITE_OTHER): Payer: Medicare Other | Admitting: Family Medicine

## 2018-09-02 VITALS — BP 125/68 | HR 80 | Temp 97.0°F | Ht 67.0 in | Wt 111.0 lb

## 2018-09-02 DIAGNOSIS — E559 Vitamin D deficiency, unspecified: Secondary | ICD-10-CM

## 2018-09-02 DIAGNOSIS — Z23 Encounter for immunization: Secondary | ICD-10-CM

## 2018-09-02 DIAGNOSIS — I6523 Occlusion and stenosis of bilateral carotid arteries: Secondary | ICD-10-CM

## 2018-09-02 DIAGNOSIS — Z780 Asymptomatic menopausal state: Secondary | ICD-10-CM

## 2018-09-02 DIAGNOSIS — Z13820 Encounter for screening for osteoporosis: Secondary | ICD-10-CM | POA: Diagnosis not present

## 2018-09-02 DIAGNOSIS — E7800 Pure hypercholesterolemia, unspecified: Secondary | ICD-10-CM

## 2018-09-02 DIAGNOSIS — Z0000 Encounter for general adult medical examination without abnormal findings: Secondary | ICD-10-CM | POA: Diagnosis not present

## 2018-09-02 NOTE — Patient Instructions (Addendum)
Medicare Annual Wellness Visit  [place] and the medical providers at [facility] strive to bring you the best medical care.  In doing so we not only want to address your current medical conditions and concerns but also to detect new conditions early and prevent illness, disease and health-related problems.    Medicare offers a yearly Wellness Visit which allows our clinical staff to assess your need for preventative services including immunizations, lifestyle education, counseling to decrease risk of preventable diseases and screening for fall risk and other medical concerns.    This visit is provided free of charge (no copay) for all Medicare recipients. The clinical pharmacists at [facility] have begun to conduct these Wellness Visits which will also include a thorough review of all your medications.    As you primary medical provider recommend that you make an appointment for your Annual Wellness Visit if you have not done so already this year.  You may set up this appointment before you leave today or you may call back [phone] and schedule an appointment.  Please make sure when you call that you mention that you are scheduling your Annual Wellness Visit with the clinical pharmacist so that the appointment may be made for the proper length of time.     Continue current medications. Continue good therapeutic lifestyle changes which include good diet and exercise. Fall precautions discussed with patient. If an FOBT was given today- please return it to our front desk. If you are over 65 years old - you may need Prevnar 13 or the adult Pneumonia vaccine.  **Flu shots are available--- please call and schedule a FLU-CLINIC appointment**  After your visit with us today you will receive a survey in the mail or online from [employer] regarding your care with us. Please take a moment to fill this out. Your feedback is very  important to us as you can help us better understand your patient needs as well as improve your experience and satisfaction. WE CARE ABOUT YOU!!!   Check with insurance about this Shingrix which is the new shingles shot Continue to drink plenty of fluids and stay well-hydrated Always be careful and to not put yourself at risk for falling Avoid climbing as much as possible Walk regularly Stay active physically

## 2018-09-02 NOTE — Progress Notes (Signed)
Subjective:    Patient ID: Caroline Hodge, female    DOB: 1951-08-28, 67 y.o.   MRN: [id number]  HPI Patient is here today for annual wellness exam and follow up of chronic medical problems which includes hyperlipidemia. She is taking medication regularly.  The patient is here for her regular physical examination with no pelvic or Pap smear.  She is due to get a mammogram and a DEXA scan and a chest x-ray.  She will be given an FOBT to return and will get lab work.  She will also get her Pneumovax 23.  Her vital signs are stable and her weight is up a couple pounds since the last visit.  In the past couple years, a bruit was found by 1 of the providers and as result of that the patient does see the vascular surgeon for this carotid bruit periodically.  The patient does take Crestor and progesterone and Estrace.  She is also on a baby aspirin.  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  She stays quite busy.  She is feeling well.  She denies any chest pain pressure tightness shortness of breath trouble with swallowing heartburn indigestion nausea vomiting diarrhea blood in the stool black tarry bowel movements or change in bowel habits.  She is passing her water well.  Her only medical thing to do is to follow-up with her carotid stenosis which she will do sometime in February and she plans to call and make an appointment with Dr. early for Dopplers.  She is up-to-date on her colonoscopies.   Patient Active Problem List   Diagnosis Date Noted  . Carotid artery disease ([place]) 11/13/2015  . Osteopenia 01/11/2013  . Vitamin D deficiency 01/11/2013   Outpatient Encounter Medications as of 09/02/2018  Medication Sig  . aspirin EC 81 MG tablet Take 1 tablet (81 mg total) by mouth daily.  . Calcium Carbonate-Vitamin D (CALCIUM-VITAMIN D) 500-200 MG-UNIT per tablet Take 1 tablet by mouth daily. Reported on 12/06/2015  .  estradiol (ESTRACE) 0.5 MG tablet TAKE 1 TABLET BY MOUTH ONCE DAILY  . progesterone (PROMETRIUM) 100 MG capsule TAKE 1 CAPSULE BY MOUTH ONCE DAILY  . rosuvastatin (CRESTOR) 5 MG tablet TAKE 1 TABLET BY MOUTH ONCE DAILY  . [DISCONTINUED] Vitamin D, Ergocalciferol, (DRISDOL) 50000 units CAPS capsule TAKE 1 CAPSULE BY MOUTH ONCE A WEEK   No facility-administered encounter medications on file as of 09/02/2018.      Review of Systems  Constitutional: Negative.   HENT: Negative.   Eyes: Negative.   Respiratory: Negative.   Cardiovascular: Negative.   Gastrointestinal: Negative.   Endocrine: Negative.   Genitourinary: Negative.   Musculoskeletal: Negative.   Skin: Negative.   Allergic/Immunologic: Negative.   Neurological: Negative.   Hematological: Negative.   Psychiatric/Behavioral: Negative.        Objective:   Physical Exam  Constitutional: She is oriented to person, place, and time. She appears well-developed and well-nourished. No distress.  The patient is pleasant and doing well with no complaints.  She is up-to-date on her eye exams and only has to visit with the vascular surgeon to follow-up on her carotid Dopplers.  HENT:  Head: Normocephalic and atraumatic.  Right Ear: External ear normal.  Left Ear: External ear normal.  Nose: Nose normal.  Mouth/Throat: Oropharynx is clear and moist.  Eyes: Pupils are equal, round, and reactive to light. Conjunctivae and EOM are  normal. Right eye exhibits no discharge. Left eye exhibits no discharge. No scleral icterus.  Neck: Normal range of motion. Neck supple. No thyromegaly present.  Right carotid bruit noted today on exam.  Cardiovascular: Normal rate, regular rhythm, normal heart sounds and intact distal pulses. Exam reveals no friction rub.  No murmur heard. Heart is regular at 72/min good pedal pulses and no edema  Pulmonary/Chest: Effort normal and breath sounds normal. She has no wheezes. She has no rales.  Lungs are clear  anteriorly and posteriorly  Abdominal: Soft. Bowel sounds are normal. She exhibits no mass. There is no tenderness. There is no rebound and no guarding.  No liver or spleen enlargement.  No epigastric tenderness.  No masses no inguinal adenopathy and no bruits.  Musculoskeletal: Normal range of motion. She exhibits no edema.  Lymphadenopathy:    She has no cervical adenopathy.  Neurological: She is alert and oriented to person, place, and time. She has normal reflexes. No cranial nerve deficit.  Neurological exam normal  Skin: Skin is warm and dry. No rash noted.  She does have a shiny skin lesion on her right back in the scapular area and she has had a dermatologist look at this and has been told that it is not anything to worry about.  She will make sure that she gets this rechecked at some point in the future.  Psychiatric: She has a normal mood and affect. Her behavior is normal. Judgment and thought content normal.  Mood affect and behavior are all normal for this patient.  Nursing note and vitals reviewed.  BP 125/68 (BP Location: Left Arm)   Pulse 80   Temp (!) 97 F (36.1 C) (Oral)   Ht _0  (1.702 m)   Wt 111 lb (50.3 kg)   BMI 17.39 kg/m         Assessment & Plan:  1. Annual physical exam -Get pelvic exam and mammogram -Follow-up with vascular surgeon for carotid Dopplers - BMP8+EGFR - CBC with Differential/Platelet - Lipid panel - VITAMIN D 25 Hydroxy (Vit-D Deficiency, Fractures) - Hepatic function panel - DG Chest 2 View; Future - DG [facility] DEXA; Future  2. Pure hypercholesterolemia -Continue atorvastatin and as aggressive therapeutic lifestyle changes as possible including diet and exercise to keep cholesterol under the best control possible - BMP8+EGFR - CBC with Differential/Platelet - Lipid panel - Hepatic function panel  3. Vitamin D deficiency -Continue with vitamin D replacement pending results of lab work - CBC with Differential/Platelet - VITAMIN  D 25 Hydroxy (Vit-D Deficiency, Fractures)  4. Screening for osteoporosis -Get DEXA scan as planned next week - DG [facility] DEXA; Future  5. Postmenopausal - DG [facility] DEXA; Future  6. Bilateral carotid artery stenosis -Rotted bruit heard only on the right side.  Patient plans to follow-up with vascular surgeon in February for repeat Dopplers. -She also plans to continue with aggressive therapeutic lifestyle changes her baby aspirin and statin therapy  Patient Instructions                       Medicare Annual Wellness Visit  [facility] and the medical providers at [facility] strive to bring you the best medical care.  In doing so we not only want to address your current medical conditions and concerns but also to detect new conditions early and prevent illness, disease and health-related problems.    Medicare offers a yearly Wellness Visit which allows our clinical staff  to assess your need for preventative services including immunizations, lifestyle education, counseling to decrease risk of preventable diseases and screening for fall risk and other medical concerns.    This visit is provided free of charge (no copay) for all Medicare recipients. The clinical pharmacists at [facility] have begun to conduct these Wellness Visits which will also include a thorough review of all your medications.    As you primary medical provider recommend that you make an appointment for your Annual Wellness Visit if you have not done so already this year.  You may set up this appointment before you leave today or you may call back ([phone]) and schedule an appointment.  Please make sure when you call that you mention that you are scheduling your Annual Wellness Visit with the clinical pharmacist so that the appointment may be made for the proper length of time.     Continue current medications. Continue good therapeutic lifestyle changes which include good diet  and exercise. Fall precautions discussed with patient. If an FOBT was given today- please return it to our front desk. If you are over 61 years old - you may need Prevnar 24 or the adult Pneumonia vaccine.  **Flu shots are available--- please call and schedule a FLU-CLINIC appointment**  After your visit with [place] today you will receive a survey in the mail or online from [employer] regarding your care with [place]. Please take a moment to fill this out. Your feedback is very important to [place] as you can help [place] better understand your patient needs as well as improve your experience and satisfaction. WE CARE ABOUT YOU!!!   Check with insurance about this Shingrix which is the new shingles shot Continue to drink plenty of fluids and stay well-hydrated Always be careful and to not put yourself at risk for falling Avoid climbing as much as possible Walk regularly Stay active physically   Arrie Senate MD

## 2018-09-03 LAB — BMP8+EGFR
BUN/Creatinine Ratio: 20 (ref 12–28)
BUN: 13 mg/dL (ref 8–27)
CALCIUM: 9.3 mg/dL (ref 8.7–10.3)
CO2: 23 mmol/L (ref 20–29)
Chloride: 102 mmol/L (ref 96–106)
Creatinine, Ser: 0.66 mg/dL (ref 0.57–1.00)
GFR calc Af Amer: 107 mL/min/{1.73_m2} (ref >59)
GFR calc non Af Amer: 93 mL/min/{1.73_m2} (ref >59)
GLUCOSE: 88 mg/dL (ref 65–99)
Potassium: 4.1 mmol/L (ref 3.5–5.2)
SODIUM: 140 mmol/L (ref 134–144)

## 2018-09-03 LAB — CBC WITH DIFFERENTIAL/PLATELET
Basophils Absolute: 0 10*3/uL (ref 0.0–0.2)
Basos: 1 %
EOS (ABSOLUTE): 0.2 10*3/uL (ref 0.0–0.4)
EOS: 3 %
HEMATOCRIT: 43.8 % (ref 34.0–46.6)
Hemoglobin: 14.5 g/dL (ref 11.1–15.9)
IMMATURE GRANULOCYTES: 0 %
Immature Grans (Abs): 0 10*3/uL (ref 0.0–0.1)
Lymphocytes Absolute: 1.5 10*3/uL (ref 0.7–3.1)
Lymphs: 28 %
MCH: 30.5 pg (ref 26.6–33.0)
MCHC: 33.1 g/dL (ref 31.5–35.7)
MCV: 92 fL (ref 79–97)
MONOS ABS: 0.5 10*3/uL (ref 0.1–0.9)
Monocytes: 9 %
NEUTROS PCT: 59 %
Neutrophils Absolute: 3.2 10*3/uL (ref 1.4–7.0)
Platelets: 246 10*3/uL (ref 150–450)
RBC: 4.76 x10E6/uL (ref 3.77–5.28)
RDW: 12.3 % (ref 12.3–15.4)
WBC: 5.4 10*3/uL (ref 3.4–10.8)

## 2018-09-03 LAB — LIPID PANEL
CHOL/HDL RATIO: 1.8 ratio (ref 0.0–4.4)
Cholesterol, Total: 137 mg/dL (ref 100–199)
HDL: 77 mg/dL (ref >39)
LDL Calculated: 50 mg/dL (ref 0–99)
TRIGLYCERIDES: 50 mg/dL (ref 0–149)
VLDL Cholesterol Cal: 10 mg/dL (ref 5–40)

## 2018-09-03 LAB — HEPATIC FUNCTION PANEL
ALBUMIN: 4.6 g/dL (ref 3.6–4.8)
ALT: 12 IU/L (ref 0–32)
AST: 16 IU/L (ref 0–40)
Alkaline Phosphatase: 47 IU/L (ref 39–117)
BILIRUBIN TOTAL: 1.5 mg/dL — AB (ref 0.0–1.2)
BILIRUBIN, DIRECT: 0.36 mg/dL (ref 0.00–0.40)
Total Protein: 6.6 g/dL (ref 6.0–8.5)

## 2018-09-03 LAB — VITAMIN D 25 HYDROXY (VIT D DEFICIENCY, FRACTURES): Vit D, 25-Hydroxy: 35.7 ng/mL (ref 30.0–100.0)

## 2018-09-09 ENCOUNTER — Other Ambulatory Visit (INDEPENDENT_AMBULATORY_CARE_PROVIDER_SITE_OTHER): Payer: Medicare Other

## 2018-09-09 DIAGNOSIS — Z780 Asymptomatic menopausal state: Secondary | ICD-10-CM

## 2018-09-09 DIAGNOSIS — M85851 Other specified disorders of bone density and structure, right thigh: Secondary | ICD-10-CM | POA: Diagnosis not present

## 2018-09-09 DIAGNOSIS — Z0000 Encounter for general adult medical examination without abnormal findings: Secondary | ICD-10-CM

## 2018-09-09 DIAGNOSIS — E7800 Pure hypercholesterolemia, unspecified: Secondary | ICD-10-CM

## 2018-09-09 DIAGNOSIS — I6523 Occlusion and stenosis of bilateral carotid arteries: Secondary | ICD-10-CM

## 2018-09-09 DIAGNOSIS — Z13820 Encounter for screening for osteoporosis: Secondary | ICD-10-CM

## 2018-09-18 ENCOUNTER — Other Ambulatory Visit: Payer: Medicare Other

## 2018-09-18 DIAGNOSIS — Z1211 Encounter for screening for malignant neoplasm of colon: Secondary | ICD-10-CM | POA: Diagnosis not present

## 2018-09-20 LAB — FECAL OCCULT BLOOD, IMMUNOCHEMICAL: Fecal Occult Bld: NEGATIVE

## 2018-09-23 ENCOUNTER — Other Ambulatory Visit: Payer: Self-pay | Admitting: *Deleted

## 2018-09-23 MED ORDER — ROSUVASTATIN CALCIUM 5 MG PO TABS: 5.0000 mg | ORAL_TABLET | Freq: Every day | ORAL | 1 refills | Status: DC

## 2018-10-09 DIAGNOSIS — Z1231 Encounter for screening mammogram for malignant neoplasm of breast: Secondary | ICD-10-CM | POA: Diagnosis not present

## 2018-10-09 LAB — HM MAMMOGRAPHY

## 2018-12-09 ENCOUNTER — Other Ambulatory Visit: Payer: Self-pay | Admitting: Family Medicine

## 2018-12-25 ENCOUNTER — Other Ambulatory Visit: Payer: Self-pay

## 2018-12-25 DIAGNOSIS — I6523 Occlusion and stenosis of bilateral carotid arteries: Secondary | ICD-10-CM

## 2018-12-29 ENCOUNTER — Ambulatory Visit: Payer: Medicare Other | Admitting: Vascular Surgery

## 2018-12-29 ENCOUNTER — Ambulatory Visit (HOSPITAL_COMMUNITY)
Admission: RE | Admit: 2018-12-29 | Discharge: 2018-12-29 | Disposition: A | Discharge status: Home / Self Care | Payer: Medicare Other | Source: Ambulatory Visit | Attending: Family | Admitting: Family

## 2018-12-29 ENCOUNTER — Other Ambulatory Visit: Payer: Self-pay

## 2018-12-29 DIAGNOSIS — I6523 Occlusion and stenosis of bilateral carotid arteries: Secondary | ICD-10-CM | POA: Insufficient documentation

## 2019-01-01 ENCOUNTER — Telehealth: Payer: Self-pay | Admitting: Vascular Surgery

## 2019-01-01 NOTE — Telephone Encounter (Signed)
I spoke with the patient by telephone regarding her recent carotid duplex on 12/29/2018.  Explained that due to Covid 19 we are trying to minimize office visits if possible.  She specifically has had no neurologic deficits new difficulties.  Her duplex shows no change in her moderate 40 to 59% narrowing bilaterally.  We will see her again in 1 year for office visit and carotid duplex

## 2019-01-05 ENCOUNTER — Other Ambulatory Visit: Payer: Self-pay

## 2019-01-05 ENCOUNTER — Telehealth: Payer: Medicare Other | Admitting: Vascular Surgery

## 2019-01-05 ENCOUNTER — Ambulatory Visit: Payer: Medicare Other | Admitting: Vascular Surgery

## 2019-01-05 NOTE — Progress Notes (Unsigned)
Virtual Visit via Telephone Note  I connected with Caroline Hodge on 01/05/19 at  9:00 AM EDT by telephone and verified that I am speaking with the correct person using two identifiers.   I discussed the limitations, risks, security and privacy concerns of performing an evaluation and management service by telephone and the availability of in person appointments. I also discussed with the patient that there may be a patient responsible charge related to this service. The patient expressed understanding and agreed to proceed.   History of Present Illness:    Observations/Objective:   Assessment and Plan:   Follow Up Instructions:    I discussed the assessment and treatment plan with the patient. The patient was provided an opportunity to ask questions and all were answered. The patient agreed with the plan and demonstrated an understanding of the instructions.   The patient was advised to call back or seek an in-person evaluation if the symptoms worsen or if the condition fails to improve as anticipated.  I provided *** minutes of non-face-to-face time during this encounter.   Gretta Began, MD

## 2019-01-12 ENCOUNTER — Ambulatory Visit: Payer: Medicare Other | Admitting: Vascular Surgery

## 2019-01-27 ENCOUNTER — Other Ambulatory Visit: Payer: Self-pay | Admitting: Nurse Practitioner

## 2019-03-26 ENCOUNTER — Other Ambulatory Visit: Payer: Self-pay | Admitting: Family Medicine

## 2019-04-05 ENCOUNTER — Other Ambulatory Visit: Payer: Self-pay | Admitting: Family Medicine

## 2019-05-03 ENCOUNTER — Other Ambulatory Visit: Payer: Self-pay | Admitting: Family Medicine

## 2019-06-29 ENCOUNTER — Other Ambulatory Visit: Payer: Self-pay | Admitting: Family Medicine

## 2019-07-06 ENCOUNTER — Other Ambulatory Visit: Payer: Self-pay | Admitting: Family Medicine

## 2019-07-26 ENCOUNTER — Ambulatory Visit (INDEPENDENT_AMBULATORY_CARE_PROVIDER_SITE_OTHER): Payer: Medicare Other

## 2019-07-26 DIAGNOSIS — Z23 Encounter for immunization: Secondary | ICD-10-CM

## 2019-08-01 ENCOUNTER — Other Ambulatory Visit: Payer: Self-pay | Admitting: Nurse Practitioner

## 2019-08-02 MED ORDER — ROSUVASTATIN CALCIUM 5 MG PO TABS: 5.0000 mg | ORAL_TABLET | Freq: Every day | ORAL | 0 refills | Status: DC

## 2019-08-02 NOTE — Telephone Encounter (Signed)
I sent a 30-day supply for her

## 2019-08-02 NOTE — Telephone Encounter (Signed)
Has an apt with [facility] 09/08/19. Patient states that she is out and would like 30 day supply.

## 2019-08-02 NOTE — Addendum Note (Signed)
Addended by: [name] on: 08/02/2019 05:19 PM   Modules accepted: Orders

## 2019-08-02 NOTE — Telephone Encounter (Signed)
[facility]. NTBS 30 days given 06/29/19 

## 2019-08-04 ENCOUNTER — Other Ambulatory Visit: Payer: Self-pay | Admitting: Family Medicine

## 2019-08-05 NOTE — Telephone Encounter (Signed)
OV 09/08/19

## 2019-08-16 DIAGNOSIS — Z961 Presence of intraocular lens: Secondary | ICD-10-CM | POA: Diagnosis not present

## 2019-08-16 DIAGNOSIS — H524 Presbyopia: Secondary | ICD-10-CM | POA: Diagnosis not present

## 2019-08-16 DIAGNOSIS — H26493 Other secondary cataract, bilateral: Secondary | ICD-10-CM | POA: Diagnosis not present

## 2019-08-16 DIAGNOSIS — H52203 Unspecified astigmatism, bilateral: Secondary | ICD-10-CM | POA: Diagnosis not present

## 2019-09-08 ENCOUNTER — Ambulatory Visit: Payer: Medicare Other | Admitting: Family Medicine

## 2019-09-08 ENCOUNTER — Other Ambulatory Visit: Payer: Self-pay

## 2019-09-08 ENCOUNTER — Ambulatory Visit (INDEPENDENT_AMBULATORY_CARE_PROVIDER_SITE_OTHER): Payer: Medicare Other | Admitting: Family Medicine

## 2019-09-08 ENCOUNTER — Encounter: Payer: Self-pay | Admitting: Family Medicine

## 2019-09-08 VITALS — BP 131/72 | HR 95 | Temp 98.6°F | Ht 67.0 in | Wt 113.0 lb

## 2019-09-08 DIAGNOSIS — Z1159 Encounter for screening for other viral diseases: Secondary | ICD-10-CM

## 2019-09-08 DIAGNOSIS — I6523 Occlusion and stenosis of bilateral carotid arteries: Secondary | ICD-10-CM | POA: Diagnosis not present

## 2019-09-08 DIAGNOSIS — Z131 Encounter for screening for diabetes mellitus: Secondary | ICD-10-CM

## 2019-09-08 DIAGNOSIS — Z0000 Encounter for general adult medical examination without abnormal findings: Secondary | ICD-10-CM | POA: Diagnosis not present

## 2019-09-08 DIAGNOSIS — E785 Hyperlipidemia, unspecified: Secondary | ICD-10-CM | POA: Diagnosis not present

## 2019-09-08 MED ORDER — PROGESTERONE MICRONIZED 100 MG PO CAPS: 100.0000 mg | ORAL_CAPSULE | Freq: Every day | ORAL | 3 refills | Status: DC

## 2019-09-08 MED ORDER — CALCIUM-VITAMIN D 500-200 MG-UNIT PO TABS: 1.0000 | ORAL_TABLET | Freq: Every day | ORAL | 3 refills | Status: DC

## 2019-09-08 MED ORDER — ESTRADIOL 0.5 MG PO TABS: 0.5000 mg | ORAL_TABLET | Freq: Every day | ORAL | 3 refills | Status: DC

## 2019-09-08 MED ORDER — ROSUVASTATIN CALCIUM 5 MG PO TABS: 5.0000 mg | ORAL_TABLET | Freq: Every day | ORAL | 3 refills | Status: DC

## 2019-09-08 NOTE — Progress Notes (Signed)
BP 131/72   Pulse 95   Temp 98.6 F (37 C) (Temporal)   Ht _0  (1.702 m)   Wt 113 lb (51.3 kg)   SpO2 100%   BMI 17.70 kg/m    Subjective:   Patient ID: Caroline Hodge, female    DOB: May 04, 1951, 68 y.o.   MRN: [id number]  HPI: Caroline Hodge is a 68 y.o. female presenting on 09/08/2019 for [place] and Annual Exam   HPI Patient is coming in for annual exam and recheck of cholesterol and chronic issues.  She denies any major health issues or concerns. Patient denies any chest pain, shortness of breath, headaches or vision issues, abdominal complaints, diarrhea, nausea, vomiting, or joint issues.  She is on hormone replacement therapy and would like to start tapering off will start taking it every other day and see how it goes.  She is also taking calcium and vitamin D for osteoporosis tries to stay active and walking consistently.  Relevant past medical, surgical, family and social history reviewed and updated as indicated. Interim medical history since our last visit reviewed. Allergies and medications reviewed and updated.  Review of Systems  Constitutional: Negative for chills and fever.  HENT: Negative for congestion, ear discharge and ear pain.   Eyes: Negative for redness and visual disturbance.  Respiratory: Negative for chest tightness and shortness of breath.   Cardiovascular: Negative for chest pain and leg swelling.  Gastrointestinal: Negative for abdominal pain, constipation, diarrhea, nausea and vomiting.  Genitourinary: Negative for difficulty urinating and dysuria.  Musculoskeletal: Negative for back pain and gait problem.  Skin: Negative for rash.  Neurological: Negative for dizziness, weakness, light-headedness and headaches.  Psychiatric/Behavioral: Negative for agitation and behavioral problems.  All other systems reviewed and are negative.   Per HPI unless specifically indicated above   Allergies as of 09/08/2019   No Known Allergies      Medication List       Accurate as of September 08, 2019 10:17 AM. If you have any questions, ask your nurse or doctor.        aspirin EC 81 MG tablet Take 1 tablet (81 mg total) by mouth daily.   calcium-vitamin D 500-200 MG-UNIT tablet Take 1 tablet by mouth daily. Reported on 12/06/2015   estradiol 0.5 MG tablet Commonly known as: ESTRACE Take 1 tablet by mouth once daily   progesterone 100 MG capsule Commonly known as: PROMETRIUM Take 1 capsule by mouth once daily   rosuvastatin 5 MG tablet Commonly known as: CRESTOR Take 1 tablet (5 mg total) by mouth daily. (Please make appt w/ new PCP)        Objective:   BP 131/72   Pulse 95   Temp 98.6 F (37 C) (Temporal)   Ht _1  (1.702 m)   Wt 113 lb (51.3 kg)   SpO2 100%   BMI 17.70 kg/m   Wt Readings from Last 3 Encounters:  09/08/19 113 lb (51.3 kg)  09/02/18 111 lb (50.3 kg)  12/02/17 108 lb 14.4 oz (49.4 kg)    Physical Exam Vitals signs and nursing note reviewed.  Constitutional:      General: She is not in acute distress.    Appearance: She is well-developed. She is not diaphoretic.  HENT:     Right Ear: Tympanic membrane normal.     Left Ear: Tympanic membrane normal.  Eyes:     Extraocular Movements: Extraocular movements intact.     Conjunctiva/sclera: Conjunctivae normal.  Pupils: Pupils are equal, round, and reactive to light.  Cardiovascular:     Rate and Rhythm: Normal rate and regular rhythm.     Heart sounds: Normal heart sounds. No murmur.  Pulmonary:     Effort: Pulmonary effort is normal. No respiratory distress.     Breath sounds: Normal breath sounds. No wheezing.  Abdominal:     General: Abdomen is flat. Bowel sounds are normal. There is no distension.     Tenderness: There is no abdominal tenderness. There is no right CVA tenderness, left CVA tenderness, guarding or rebound.  Musculoskeletal: Normal range of motion.        General: No tenderness.  Skin:    General: Skin is warm  and dry.     Findings: No rash.  Neurological:     Mental Status: She is alert and oriented to person, place, and time.     Coordination: Coordination normal.  Psychiatric:        Behavior: Behavior normal.       Assessment & Plan:   Problem List Items Addressed This Visit      Other   Dyslipidemia   Relevant Medications   rosuvastatin (CRESTOR) 5 MG tablet   Other Relevant Orders   CBC with Differential/Platelet   Lipid panel    Other Visit Diagnoses    Well adult exam    -  Primary   Relevant Orders   CBC with Differential/Platelet   CMP14+EGFR   Need for hepatitis C screening test       Relevant Orders   Hepatitis C Antibody   CBC with Differential/Platelet   Diabetes mellitus screening       Relevant Orders   CBC with Differential/Platelet   CMP14+EGFR       Follow up plan: Return in about 1 year (around 09/07/2020), or if symptoms worsen or fail to improve.  Counseling provided for all of the vaccine components No orders of the defined types were placed in this encounter.   [name], MD [facility] 09/08/2019, 10:17 AM

## 2019-09-09 LAB — CMP14+EGFR
ALT: 10 IU/L (ref 0–32)
AST: 18 IU/L (ref 0–40)
Albumin/Globulin Ratio: 2.3 — ABNORMAL HIGH (ref 1.2–2.2)
Albumin: 4.6 g/dL (ref 3.8–4.8)
Alkaline Phosphatase: 45 IU/L (ref 39–117)
BUN/Creatinine Ratio: 21 (ref 12–28)
BUN: 14 mg/dL (ref 8–27)
Bilirubin Total: 1.2 mg/dL (ref 0.0–1.2)
CO2: 23 mmol/L (ref 20–29)
Calcium: 9.1 mg/dL (ref 8.7–10.3)
Chloride: 104 mmol/L (ref 96–106)
Creatinine, Ser: 0.67 mg/dL (ref 0.57–1.00)
GFR calc Af Amer: 106 mL/min/{1.73_m2} (ref >59)
GFR calc non Af Amer: 92 mL/min/{1.73_m2} (ref >59)
Globulin, Total: 2 g/dL (ref 1.5–4.5)
Glucose: 90 mg/dL (ref 65–99)
Potassium: 4.3 mmol/L (ref 3.5–5.2)
Sodium: 142 mmol/L (ref 134–144)
Total Protein: 6.6 g/dL (ref 6.0–8.5)

## 2019-09-09 LAB — LIPID PANEL
Chol/HDL Ratio: 2 ratio (ref 0.0–4.4)
Cholesterol, Total: 141 mg/dL (ref 100–199)
HDL: 72 mg/dL (ref >39)
LDL Chol Calc (NIH): 56 mg/dL (ref 0–99)
Triglycerides: 61 mg/dL (ref 0–149)
VLDL Cholesterol Cal: 13 mg/dL (ref 5–40)

## 2019-09-09 LAB — CBC WITH DIFFERENTIAL/PLATELET
Basophils Absolute: 0 10*3/uL (ref 0.0–0.2)
Basos: 0 %
EOS (ABSOLUTE): 0.1 10*3/uL (ref 0.0–0.4)
Eos: 2 %
Hematocrit: 40.1 % (ref 34.0–46.6)
Hemoglobin: 13.6 g/dL (ref 11.1–15.9)
Immature Grans (Abs): 0 10*3/uL (ref 0.0–0.1)
Immature Granulocytes: 0 %
Lymphocytes Absolute: 1.5 10*3/uL (ref 0.7–3.1)
Lymphs: 28 %
MCH: 30.7 pg (ref 26.6–33.0)
MCHC: 33.9 g/dL (ref 31.5–35.7)
MCV: 91 fL (ref 79–97)
Monocytes Absolute: 0.5 10*3/uL (ref 0.1–0.9)
Monocytes: 9 %
Neutrophils Absolute: 3.2 10*3/uL (ref 1.4–7.0)
Neutrophils: 61 %
Platelets: 249 10*3/uL (ref 150–450)
RBC: 4.43 x10E6/uL (ref 3.77–5.28)
RDW: 13 % (ref 11.7–15.4)
WBC: 5.3 10*3/uL (ref 3.4–10.8)

## 2019-09-09 LAB — HEPATITIS C ANTIBODY: Hep C Virus Ab: <0.1 s/co ratio (ref 0.0–0.9)

## 2019-09-20 DIAGNOSIS — H26493 Other secondary cataract, bilateral: Secondary | ICD-10-CM | POA: Diagnosis not present

## 2019-09-20 DIAGNOSIS — H02831 Dermatochalasis of right upper eyelid: Secondary | ICD-10-CM | POA: Diagnosis not present

## 2019-09-20 DIAGNOSIS — H02834 Dermatochalasis of left upper eyelid: Secondary | ICD-10-CM | POA: Diagnosis not present

## 2019-09-20 DIAGNOSIS — Z961 Presence of intraocular lens: Secondary | ICD-10-CM | POA: Diagnosis not present

## 2019-11-12 ENCOUNTER — Ambulatory Visit: Payer: Medicare Other

## 2019-11-13 ENCOUNTER — Other Ambulatory Visit: Payer: Self-pay | Admitting: Family Medicine

## 2019-11-17 ENCOUNTER — Ambulatory Visit: Payer: Medicare Other

## 2019-11-18 DIAGNOSIS — Z23 Encounter for immunization: Secondary | ICD-10-CM | POA: Diagnosis not present

## 2019-11-20 ENCOUNTER — Ambulatory Visit: Payer: Medicare Other

## 2019-12-17 DIAGNOSIS — Z23 Encounter for immunization: Secondary | ICD-10-CM | POA: Diagnosis not present

## 2019-12-30 ENCOUNTER — Encounter: Payer: Self-pay | Admitting: *Deleted

## 2020-01-25 ENCOUNTER — Other Ambulatory Visit: Payer: Self-pay | Admitting: *Deleted

## 2020-01-25 DIAGNOSIS — I6523 Occlusion and stenosis of bilateral carotid arteries: Secondary | ICD-10-CM

## 2020-01-31 ENCOUNTER — Telehealth (HOSPITAL_COMMUNITY): Payer: Self-pay

## 2020-01-31 NOTE — Telephone Encounter (Signed)

## 2020-02-01 ENCOUNTER — Encounter: Payer: Self-pay | Admitting: Vascular Surgery

## 2020-02-01 ENCOUNTER — Ambulatory Visit (INDEPENDENT_AMBULATORY_CARE_PROVIDER_SITE_OTHER): Payer: Medicare Other | Admitting: Vascular Surgery

## 2020-02-01 ENCOUNTER — Other Ambulatory Visit: Payer: Self-pay

## 2020-02-01 ENCOUNTER — Ambulatory Visit (HOSPITAL_COMMUNITY)
Admission: RE | Admit: 2020-02-01 | Discharge: 2020-02-01 | Disposition: A | Discharge status: Home / Self Care | Payer: Medicare Other | Source: Ambulatory Visit | Attending: Vascular Surgery | Admitting: Vascular Surgery

## 2020-02-01 VITALS — BP 134/75 | HR 90 | Temp 97.6°F | Resp 20 | Ht 67.0 in | Wt 110.3 lb

## 2020-02-01 DIAGNOSIS — I6523 Occlusion and stenosis of bilateral carotid arteries: Secondary | ICD-10-CM

## 2020-02-01 NOTE — Progress Notes (Signed)
[facility]  Patient name: Caroline Hodge MRN: [id number] DOB: 1951/04/14 Sex: female  REASON FOR VISIT: Follow-up asymptomatic carotid disease  HPI: Caroline Hodge is a 69 y.o. female here today for follow-up.  She has had no new medical problems.  She has had no symptoms of amaurosis fugax, aphasia, transient ischemic attack or stroke.  She has had no cardiac difficulties.  She is right-handed  Past Medical History:  Diagnosis Date  . Carotid artery occlusion   . Osteopenia   . Personal history of kidney stones 2007  . Vitamin D deficiency     Family History  Problem Relation Age of Onset  . Healthy Mother   . Healthy Father   . Colon cancer Neg Hx     SOCIAL HISTORY: Social History   Tobacco Use  . Smoking status: Never Smoker  . Smokeless tobacco: Never Used  Substance Use Topics  . Alcohol use: Yes    Alcohol/week: 2.0 standard drinks    Types: 1 Glasses of wine, 1 Standard drinks or equivalent per week    No Known Allergies  Current Outpatient Medications  Medication Sig Dispense Refill  . aspirin EC 81 MG tablet Take 1 tablet (81 mg total) by mouth daily. 100 tablet 11  . Calcium Carb-Cholecalciferol (CALCIUM-VITAMIN D) 500-200 MG-UNIT tablet Take 1 tablet by mouth daily. Reported on 12/06/2015 90 tablet 3  . estradiol (ESTRACE) 0.5 MG tablet Take 1 tablet by mouth once daily (Patient taking differently: Take 0.5 mg by mouth every other day. ) 90 tablet 0  . progesterone (PROMETRIUM) 100 MG capsule Take 1 capsule (100 mg total) by mouth daily. (Patient taking differently: Take 100 mg by mouth every other day. ) 90 capsule 3  . rosuvastatin (CRESTOR) 5 MG tablet Take 1 tablet (5 mg total) by mouth daily. 90 tablet 3   No current facility-administered medications for this visit.    REVIEW OF SYSTEMS:  [X]  denotes positive finding, [ ]  denotes negative finding Cardiac  Comments:  Chest pain or  chest pressure:    Shortness of breath upon exertion:    Short of breath when lying flat:    Irregular heart rhythm:        Vascular    Pain in calf, thigh, or hip brought on by ambulation:    Pain in feet at night that wakes you up from your sleep:     Blood clot in your veins:    Leg swelling:           PHYSICAL EXAM: Vitals:   02/01/20 1420 02/01/20 1421  BP: (!) 144/75 134/75  Pulse: 90   Resp: 20   Temp: 97.6 F (36.4 C)   SpO2: 98%   Weight: 110 lb 4.8 oz (50 kg)   Height: 5\' 7"  (1.702 m)     GENERAL: The patient is a well-nourished female, in no acute distress. The vital signs are documented above. CARDIOVASCULAR: Carotid arteries are without bruits bilaterally.  Radial pulses are 2+ bilaterally PULMONARY: There is good air exchange  MUSCULOSKELETAL: There are no major deformities or cyanosis. NEUROLOGIC: No focal weakness or paresthesias are detected. SKIN: There are no ulcers or rashes noted. PSYCHIATRIC: The patient has a normal affect.  DATA:  She underwent carotid duplex today in our office and I reviewed this her with her and also her prior duplex studies.  This shows predicted 60 to 79% stenosis in her right internal carotid artery at the lower  end of the scale.  On the left her stenosis is in the 40 to 59% range.  MEDICAL ISSUES: Moderate bilateral carotid stenoses which has remained asymptomatic.  She does not have any main major risk factors.  She will continue her usual activities and will see [place] again in 1 year with repeat carotid duplex.  I again reviewed symptoms of carotid disease and she knows to report immediately to the hospital should this occur    Caroline Posner, MD [facility] Tel [phone] Pager [phone]

## 2020-02-02 ENCOUNTER — Other Ambulatory Visit: Payer: Self-pay | Admitting: *Deleted

## 2020-02-02 DIAGNOSIS — I6523 Occlusion and stenosis of bilateral carotid arteries: Secondary | ICD-10-CM

## 2020-03-08 DIAGNOSIS — Z1231 Encounter for screening mammogram for malignant neoplasm of breast: Secondary | ICD-10-CM | POA: Diagnosis not present

## 2020-07-20 DIAGNOSIS — Z23 Encounter for immunization: Secondary | ICD-10-CM | POA: Diagnosis not present

## 2020-08-09 DIAGNOSIS — Z23 Encounter for immunization: Secondary | ICD-10-CM | POA: Diagnosis not present

## 2020-09-06 ENCOUNTER — Ambulatory Visit (INDEPENDENT_AMBULATORY_CARE_PROVIDER_SITE_OTHER): Payer: Medicare Other | Admitting: Family Medicine

## 2020-09-06 ENCOUNTER — Other Ambulatory Visit: Payer: Self-pay

## 2020-09-06 ENCOUNTER — Encounter: Payer: Self-pay | Admitting: Family Medicine

## 2020-09-06 ENCOUNTER — Other Ambulatory Visit (HOSPITAL_COMMUNITY)
Admission: RE | Admit: 2020-09-06 | Discharge: 2020-09-06 | Disposition: A | Discharge status: Home / Self Care | Payer: Medicare Other | Source: Ambulatory Visit | Attending: Family Medicine | Admitting: Family Medicine

## 2020-09-06 VITALS — BP 130/68 | HR 99 | Ht 67.0 in | Wt 111.0 lb

## 2020-09-06 DIAGNOSIS — E785 Hyperlipidemia, unspecified: Secondary | ICD-10-CM

## 2020-09-06 DIAGNOSIS — Z01419 Encounter for gynecological examination (general) (routine) without abnormal findings: Secondary | ICD-10-CM

## 2020-09-06 DIAGNOSIS — E559 Vitamin D deficiency, unspecified: Secondary | ICD-10-CM | POA: Diagnosis not present

## 2020-09-06 LAB — LIPID PANEL

## 2020-09-06 MED ORDER — PROGESTERONE MICRONIZED 100 MG PO CAPS: 100.0000 mg | ORAL_CAPSULE | Freq: Every day | ORAL | 3 refills | Status: DC

## 2020-09-06 MED ORDER — ESTRADIOL 0.5 MG PO TABS: 0.5000 mg | ORAL_TABLET | Freq: Every day | ORAL | 3 refills | Status: DC

## 2020-09-06 MED ORDER — ROSUVASTATIN CALCIUM 5 MG PO TABS: 5.0000 mg | ORAL_TABLET | Freq: Every day | ORAL | 3 refills | Status: DC

## 2020-09-06 NOTE — Progress Notes (Signed)
BP 130/68   Pulse 99   Ht '5\' 7"'  (1.702 m)   Wt 111 lb (50.3 kg)   SpO2 100%   BMI 17.39 kg/m    Subjective:   Patient ID: Caroline Hodge, female    DOB: Feb 28, 1951, 69 y.o.   MRN: [id number]  HPI: Caroline Hodge is a 69 y.o. female presenting on 09/06/2020 for Medical Management of Chronic Issues (CPE with pap)   HPI Patient is coming in today for well woman exam and Pap smear and physical. She had a mammogram earlier in the year and it was normal. She continues to take the estradiol and progesterone although she has backed off to every other day and feels like it still doing well for her. She takes a cholesterol medicine as well and will recheck the levels today. She also takes baby aspirin and calcium and vitamin D.  Relevant past medical, surgical, family and social history reviewed and updated as indicated. Interim medical history since our last visit reviewed. Allergies and medications reviewed and updated.  Review of Systems  Constitutional: Negative for chills and fever.  HENT: Negative for ear pain and tinnitus.   Eyes: Negative for pain.  Respiratory: Negative for cough, shortness of breath and wheezing.   Cardiovascular: Negative for chest pain, palpitations and leg swelling.  Gastrointestinal: Negative for abdominal pain, blood in stool, constipation and diarrhea.  Genitourinary: Negative for dysuria and hematuria.  Musculoskeletal: Negative for back pain and myalgias.  Skin: Negative for rash.  Neurological: Negative for dizziness, weakness and headaches.  Psychiatric/Behavioral: Negative for suicidal ideas.    Per HPI unless specifically indicated above   Allergies as of 09/06/2020   No Known Allergies     Medication List       Accurate as of September 06, 2020 11:06 AM. If you have any questions, ask your nurse or doctor.        STOP taking these medications   progesterone 100 MG capsule Commonly known as: PROMETRIUM Stopped by: Fransisca Kaufmann Ramla Hase,  MD     TAKE these medications   aspirin EC 81 MG tablet Take 1 tablet (81 mg total) by mouth daily.   calcium-vitamin D 500-200 MG-UNIT tablet Take 1 tablet by mouth daily. Reported on 12/06/2015   estradiol 0.5 MG tablet Commonly known as: ESTRACE Take 1 tablet (0.5 mg total) by mouth daily. What changed: when to take this   progesterone 100 MG capsule Commonly known as: PROMETRIUM Take 1 capsule (100 mg total) by mouth daily. Started by: Fransisca Kaufmann Anastashia Westerfeld, MD   rosuvastatin 5 MG tablet Commonly known as: CRESTOR Take 1 tablet (5 mg total) by mouth daily.        Objective:   BP 130/68   Pulse 99   Ht '5\' 7"'  (1.702 m)   Wt 111 lb (50.3 kg)   SpO2 100%   BMI 17.39 kg/m   Wt Readings from Last 3 Encounters:  09/06/20 111 lb (50.3 kg)  02/01/20 110 lb 4.8 oz (50 kg)  09/08/19 113 lb (51.3 kg)    Physical Exam Vitals reviewed. Exam conducted with a chaperone present.  Constitutional:      General: She is not in acute distress.    Appearance: She is well-developed. She is not diaphoretic.  Eyes:     Extraocular Movements: Extraocular movements intact.     Conjunctiva/sclera: Conjunctivae normal.     Pupils: Pupils are equal, round, and reactive to light.  Neck:     Thyroid:  No thyromegaly.  Cardiovascular:     Rate and Rhythm: Normal rate and regular rhythm.     Heart sounds: Normal heart sounds. No murmur heard.   Pulmonary:     Effort: Pulmonary effort is normal. No respiratory distress.     Breath sounds: Normal breath sounds. No wheezing.  Chest:     Breasts: Breasts are symmetrical.        Right: No inverted nipple, mass, nipple discharge, skin change or tenderness.        Left: No inverted nipple, mass, nipple discharge, skin change or tenderness.  Abdominal:     General: Bowel sounds are normal. There is no distension.     Palpations: Abdomen is soft.     Tenderness: There is no abdominal tenderness. There is no guarding or rebound.   Genitourinary:    Exam position: Lithotomy position.     Labia:        Right: No rash or lesion.        Left: No rash or lesion.      Vagina: Normal.     Cervix: No cervical motion tenderness, discharge or friability.     Uterus: Not deviated, not enlarged, not fixed and not tender.      Adnexa:        Right: No mass or tenderness.         Left: No mass or tenderness.    Musculoskeletal:        General: Normal range of motion.     Cervical back: Neck supple.  Lymphadenopathy:     Cervical: No cervical adenopathy.  Skin:    General: Skin is warm and dry.     Findings: No rash.  Neurological:     Mental Status: She is alert and oriented to person, place, and time.     Coordination: Coordination normal.  Psychiatric:        Behavior: Behavior normal.       Assessment & Plan:   Problem List Items Addressed This Visit      Other   Vitamin D deficiency   Relevant Orders   VITAMIN D 25 Hydroxy (Vit-D Deficiency, Fractures)   Dyslipidemia   Relevant Medications   rosuvastatin (CRESTOR) 5 MG tablet   Other Relevant Orders   Lipid panel    Other Visit Diagnoses    Encounter for well woman exam with routine gynecological exam    -  Primary   Relevant Orders   Cytology - PAP([place])   CBC with Differential/Platelet   CMP14+EGFR       Follow up plan: Return in about 1 year (around 09/06/2021), or if symptoms worsen or fail to improve, for Recheck cholesterol and well adult exam.  Counseling provided for all of the vaccine components Orders Placed This Encounter  Procedures  . CBC with Differential/Platelet  . CMP14+EGFR  . Lipid panel  . VITAMIN D 25 Hydroxy (Vit-D Deficiency, Fractures)    [name], MD [facility] 09/06/2020, 11:06 AM

## 2020-09-07 LAB — CBC WITH DIFFERENTIAL/PLATELET
Basophils Absolute: 0 10*3/uL (ref 0.0–0.2)
Basos: 1 %
EOS (ABSOLUTE): 0.1 10*3/uL (ref 0.0–0.4)
Eos: 1 %
Hematocrit: 43.7 % (ref 34.0–46.6)
Hemoglobin: 14 g/dL (ref 11.1–15.9)
Immature Grans (Abs): 0 10*3/uL (ref 0.0–0.1)
Immature Granulocytes: 0 %
Lymphocytes Absolute: 1.7 10*3/uL (ref 0.7–3.1)
Lymphs: 26 %
MCH: 28.6 pg (ref 26.6–33.0)
MCHC: 32 g/dL (ref 31.5–35.7)
MCV: 89 fL (ref 79–97)
Monocytes Absolute: 0.6 10*3/uL (ref 0.1–0.9)
Monocytes: 10 %
Neutrophils Absolute: 4.1 10*3/uL (ref 1.4–7.0)
Neutrophils: 62 %
Platelets: 296 10*3/uL (ref 150–450)
RBC: 4.9 x10E6/uL (ref 3.77–5.28)
RDW: 12.1 % (ref 11.7–15.4)
WBC: 6.5 10*3/uL (ref 3.4–10.8)

## 2020-09-07 LAB — CMP14+EGFR
ALT: 7 IU/L (ref 0–32)
AST: 18 IU/L (ref 0–40)
Albumin/Globulin Ratio: 2.3 — ABNORMAL HIGH (ref 1.2–2.2)
Albumin: 4.9 g/dL — ABNORMAL HIGH (ref 3.8–4.8)
Alkaline Phosphatase: 48 IU/L (ref 44–121)
BUN/Creatinine Ratio: 22 (ref 12–28)
BUN: 17 mg/dL (ref 8–27)
Bilirubin Total: 1 mg/dL (ref 0.0–1.2)
CO2: 26 mmol/L (ref 20–29)
Calcium: 9.3 mg/dL (ref 8.7–10.3)
Chloride: 100 mmol/L (ref 96–106)
Creatinine, Ser: 0.76 mg/dL (ref 0.57–1.00)
GFR calc Af Amer: 94 mL/min/{1.73_m2} (ref >59)
GFR calc non Af Amer: 81 mL/min/{1.73_m2} (ref >59)
Globulin, Total: 2.1 g/dL (ref 1.5–4.5)
Glucose: 93 mg/dL (ref 65–99)
Potassium: 4.1 mmol/L (ref 3.5–5.2)
Sodium: 140 mmol/L (ref 134–144)
Total Protein: 7 g/dL (ref 6.0–8.5)

## 2020-09-07 LAB — VITAMIN D 25 HYDROXY (VIT D DEFICIENCY, FRACTURES): Vit D, 25-Hydroxy: 39.1 ng/mL (ref 30.0–100.0)

## 2020-09-07 LAB — LIPID PANEL
Chol/HDL Ratio: 2 ratio (ref 0.0–4.4)
Cholesterol, Total: 157 mg/dL (ref 100–199)
HDL: 77 mg/dL (ref >39)
LDL Chol Calc (NIH): 70 mg/dL (ref 0–99)
Triglycerides: 46 mg/dL (ref 0–149)
VLDL Cholesterol Cal: 10 mg/dL (ref 5–40)

## 2020-09-08 ENCOUNTER — Ambulatory Visit: Payer: Medicare Other | Admitting: Family Medicine

## 2020-09-12 LAB — CYTOLOGY - PAP: Diagnosis: NEGATIVE

## 2020-11-20 ENCOUNTER — Other Ambulatory Visit: Payer: Self-pay | Admitting: Family Medicine

## 2021-02-05 ENCOUNTER — Other Ambulatory Visit: Payer: Self-pay

## 2021-02-05 ENCOUNTER — Encounter: Payer: Self-pay | Admitting: Vascular Surgery

## 2021-02-05 ENCOUNTER — Ambulatory Visit (INDEPENDENT_AMBULATORY_CARE_PROVIDER_SITE_OTHER): Payer: Medicare Other | Admitting: Vascular Surgery

## 2021-02-05 ENCOUNTER — Ambulatory Visit (INDEPENDENT_AMBULATORY_CARE_PROVIDER_SITE_OTHER): Payer: Medicare Other

## 2021-02-05 VITALS — BP 127/74 | HR 77 | Temp 98.4°F | Resp 12 | Ht 67.0 in | Wt 114.0 lb

## 2021-02-05 DIAGNOSIS — I6523 Occlusion and stenosis of bilateral carotid arteries: Secondary | ICD-10-CM

## 2021-02-05 NOTE — Progress Notes (Signed)
[facility]  Patient name: Caroline Hodge MRN: [id number] DOB: 1951/06/22 Sex: female  REASON FOR VISIT: Follow-up known asymptomatic carotid disease  HPI: Caroline Hodge is a 70 y.o. female here today for follow-up.  She has known moderate bilateral carotid disease.  She remains completely asymptomatic.  No history of aphasia, amaurosis fugax, transient ischemic attack or stroke.  She is quite active and healthy.  Past Medical History:  Diagnosis Date  . Carotid artery occlusion   . Osteopenia   . Personal history of kidney stones 2007  . Vitamin D deficiency     Family History  Problem Relation Age of Onset  . Healthy Mother   . Healthy Father   . Colon cancer Neg Hx     SOCIAL HISTORY: Social History   Tobacco Use  . Smoking status: Never Smoker  . Smokeless tobacco: Never Used  Substance Use Topics  . Alcohol use: Yes    Alcohol/week: 2.0 standard drinks    Types: 1 Glasses of wine, 1 Standard drinks or equivalent per week    No Known Allergies  Current Outpatient Medications  Medication Sig Dispense Refill  . aspirin EC 81 MG tablet Take 1 tablet (81 mg total) by mouth daily. 100 tablet 11  . Calcium Carb-Cholecalciferol (CALCIUM-VITAMIN D) 500-200 MG-UNIT tablet Take 1 tablet by mouth daily. Reported on 12/06/2015 90 tablet 3  . estradiol (ESTRACE) 0.5 MG tablet Take 1 tablet (0.5 mg total) by mouth daily. 90 tablet 3  . progesterone (PROMETRIUM) 100 MG capsule Take 1 capsule (100 mg total) by mouth daily. 90 capsule 3  . rosuvastatin (CRESTOR) 5 MG tablet Take 1 tablet (5 mg total) by mouth daily. 90 tablet 3   No current facility-administered medications for this visit.    REVIEW OF SYSTEMS:  [X]  denotes positive finding, [ ]  denotes negative finding Cardiac  Comments:  Chest pain or chest pressure:    Shortness of breath upon exertion:    Short of breath when lying flat:    Irregular  heart rhythm:        Vascular    Pain in calf, thigh, or hip brought on by ambulation:    Pain in feet at night that wakes you up from your sleep:     Blood clot in your veins:    Leg swelling:           PHYSICAL EXAM: Vitals:   02/05/21 1417  BP: 127/74  Pulse: 77  Resp: 12  Temp: 98.4 F (36.9 C)  TempSrc: Other (Comment)  SpO2: 100%  Weight: 114 lb (51.7 kg)  Height: 5\' 7"  (1.702 m)    GENERAL: The patient is a well-nourished female, in no acute distress. The vital signs are documented above. CARDIOVASCULAR: Carotid arteries without bruits bilaterally.  2+ radial and 2+ dorsalis pedis pulses bilaterally. PULMONARY: There is good air exchange  MUSCULOSKELETAL: There are no major deformities or cyanosis. NEUROLOGIC: No focal weakness or paresthesias are detected. SKIN: There are no ulcers or rashes noted. PSYCHIATRIC: The patient has a normal affect.  DATA:  Carotid duplex today was reviewed with the patient.  This does show slight progression in velocities on the right carotid.  Predicted stenosis is 60 to 79% on the right at the lower end of this range.  On the left her stenosis is in the 40 to 59% range.  MEDICAL ISSUES: Stable carotid disease.  I again reviewed symptoms with the patient and she knows  to report should she develop any neurologic deficits.  Otherwise we will see her again in 1 year with repeat carotid duplex    Larina Earthly, MD FACS [facility] [phone]  Note: Portions of this report may have been transcribed using voice recognition software.  Every effort has been made to ensure accuracy; however, inadvertent computerized transcription errors may still be present.

## 2021-02-06 DIAGNOSIS — Z23 Encounter for immunization: Secondary | ICD-10-CM | POA: Diagnosis not present

## 2021-03-26 ENCOUNTER — Ambulatory Visit (INDEPENDENT_AMBULATORY_CARE_PROVIDER_SITE_OTHER): Payer: Medicare Other | Admitting: Family Medicine

## 2021-03-26 ENCOUNTER — Encounter: Payer: Self-pay | Admitting: Family Medicine

## 2021-03-26 ENCOUNTER — Other Ambulatory Visit: Payer: Self-pay

## 2021-03-26 VITALS — BP 130/73 | HR 91 | Temp 98.1°F | Ht 67.0 in | Wt 115.1 lb

## 2021-03-26 DIAGNOSIS — J392 Other diseases of pharynx: Secondary | ICD-10-CM

## 2021-03-26 NOTE — Progress Notes (Signed)
Acute Office Visit  Subjective:    Patient ID: Caroline Hodge, female    DOB: [birth year], 70 y.o.   MRN: [id number]  Chief Complaint  Patient presents with   Sore Throat    HPI Patient is in today for throat irritation. She reports that her throat has felt scratchy for the last 2-3 weeks. She has had a occasional dry cough that feels like a tickle. She denies congestion, fever, headache, body aches, chills, or ear pain. She has taken tylenol sinus and regular tylenol with a little relief. She had been doing a lot of yard work outside prior to this.   Past Medical History:  Diagnosis Date   Carotid artery occlusion    Osteopenia    Personal history of kidney stones 2007   Vitamin D deficiency     Past Surgical History:  Procedure Laterality Date   CATARACT EXTRACTION W/PHACO Right 04/25/2014   Procedure: CATARACT EXTRACTION PHACO AND INTRAOCULAR LENS PLACEMENT (IOC);  Surgeon: Gemma Payor, MD;  Location: [facility];  Service: Ophthalmology;  Laterality: Right;  CDE 8.46   CATARACT EXTRACTION W/PHACO Left 05/09/2014   Procedure: CATARACT EXTRACTION PHACO AND INTRAOCULAR LENS PLACEMENT LEFT EYE CDE=6.23;  Surgeon: Gemma Payor, MD;  Location: [facility];  Service: Ophthalmology;  Laterality: Left;   COLONOSCOPY  2006   SKIN GRAFT Left 1975   foot; after foot injury    Family History  Problem Relation Age of Onset   Healthy Mother    Healthy Father    Colon cancer Neg Hx     Social History   Socioeconomic History   Marital status: Single    Spouse name: Not on file   Number of children: Not on file   Years of education: Not on file   Highest education level: Not on file  Occupational History   Occupation: retired  Tobacco Use   Smoking status: Never   Smokeless tobacco: Never  Vaping Use   Vaping Use: Never used  Substance and Sexual Activity   Alcohol use: Yes    Alcohol/week: 2.0 standard drinks    Types: 1 Glasses of wine, 1 Standard drinks or equivalent per week   Drug  use: No   Sexual activity: Yes  Other Topics Concern   Not on file  Social History Narrative   Not on file   Social Determinants of Health   Financial Resource Strain: Not on file  Food Insecurity: Not on file  Transportation Needs: Not on file  Physical Activity: Not on file  Stress: Not on file  Social Connections: Not on file  Intimate Partner Violence: Not on file    Outpatient Medications Prior to Visit  Medication Sig Dispense Refill   aspirin EC 81 MG tablet Take 1 tablet (81 mg total) by mouth daily. 100 tablet 11   Calcium Carb-Cholecalciferol (CALCIUM-VITAMIN D) 500-200 MG-UNIT tablet Take 1 tablet by mouth daily. Reported on 12/06/2015 90 tablet 3   estradiol (ESTRACE) 0.5 MG tablet Take 1 tablet (0.5 mg total) by mouth daily. 90 tablet 3   progesterone (PROMETRIUM) 100 MG capsule Take 1 capsule (100 mg total) by mouth daily. 90 capsule 3   rosuvastatin (CRESTOR) 5 MG tablet Take 1 tablet (5 mg total) by mouth daily. 90 tablet 3   No facility-administered medications prior to visit.    No Known Allergies  Review of Systems As per HPI.     Objective:    Physical Exam Constitutional:      General:  She is not in acute distress.    Appearance: She is not ill-appearing, toxic-appearing or diaphoretic.  HENT:     Head: Normocephalic and atraumatic.     Right Ear: Tympanic membrane and ear canal normal.     Left Ear: Tympanic membrane and ear canal normal.     Nose: No congestion.     Mouth/Throat:     Mouth: Mucous membranes are moist.     Pharynx: Oropharynx is clear. No oropharyngeal exudate, posterior oropharyngeal erythema or uvula swelling.     Tonsils: No tonsillar exudate or tonsillar abscesses. 0 on the right. 0 on the left.  Eyes:     Conjunctiva/sclera: Conjunctivae normal.  Cardiovascular:     Rate and Rhythm: Normal rate and regular rhythm.     Heart sounds: Normal heart sounds.  Musculoskeletal:     Cervical back: Neck supple.  Lymphadenopathy:      Cervical: No cervical adenopathy.  Skin:    General: Skin is warm and dry.  Neurological:     General: No focal deficit present.     Mental Status: She is alert and oriented to person, place, and time.  Psychiatric:        Mood and Affect: Mood normal.        Behavior: Behavior normal.    BP 130/73   Pulse 91   Temp 98.1 F (36.7 C) (Temporal)   Ht 5\' 7"  (1.702 m)   Wt 115 lb 2 oz (52.2 kg)   BMI 18.03 kg/m  Wt Readings from Last 3 Encounters:  03/26/21 115 lb 2 oz (52.2 kg)  02/05/21 114 lb (51.7 kg)  09/06/20 111 lb (50.3 kg)    Health Maintenance Due  Topic Date Due   Zoster Vaccines- Shingrix (1 of 2) Never done    There are no preventive care reminders to display for this patient.   Lab Results  Component Value Date   TSH 2.380 09/11/2017   Lab Results  Component Value Date   WBC 6.5 09/06/2020   HGB 14.0 09/06/2020   HCT 43.7 09/06/2020   MCV 89 09/06/2020   PLT 296 09/06/2020   Lab Results  Component Value Date   NA 140 09/06/2020   K 4.1 09/06/2020   CO2 26 09/06/2020   GLUCOSE 93 09/06/2020   BUN 17 09/06/2020   CREATININE 0.76 09/06/2020   BILITOT 1.0 09/06/2020   ALKPHOS 48 09/06/2020   AST 18 09/06/2020   ALT 7 09/06/2020   PROT 7.0 09/06/2020   ALBUMIN 4.9 (H) 09/06/2020   CALCIUM 9.3 09/06/2020   ANIONGAP 9 04/19/2014   Lab Results  Component Value Date   CHOL 157 09/06/2020   Lab Results  Component Value Date   HDL 77 09/06/2020   Lab Results  Component Value Date   LDLCALC 70 09/06/2020   Lab Results  Component Value Date   TRIG 46 09/06/2020   Lab Results  Component Value Date   CHOLHDL 2.0 09/06/2020   No results found for: HGBA1C     Assessment & Plan:   Laveyah was seen today for sore throat.  Diagnoses and all orders for this visit:  Throat irritation Benign exam today with no other symptoms. Patient will try zyrtec OTC. Return to office for new or worsening symptoms, or if symptoms persist.    The  patient indicates understanding of these issues and agrees with the plan.   Stanton Kidney, FNP

## 2021-03-26 NOTE — Patient Instructions (Signed)
Allergic Rhinitis, Adult  Allergic rhinitis is an allergic reaction that affects the mucous membraneinside the nose. The mucous membrane is the tissue that produces mucus. There are two types of allergic rhinitis: Seasonal. This type is also called hay fever and happens only during certain seasons. Perennial. This type can happen at any time of the year. Allergic rhinitis cannot be spread from person to person. This condition can bemild, moderate, or severe. It can develop at any age and may be outgrown. What are the causes? This condition is caused by allergens. These are things that can cause an allergic reaction. Allergens may differ for seasonal allergic rhinitis and perennial allergic rhinitis. Seasonal allergic rhinitis is triggered by pollen. Pollen can come from grasses, trees, and weeds. Perennial allergic rhinitis may be triggered by: Dust mites. Proteins in a pet's urine, saliva, or dander. Dander is dead skin cells from a pet. Smoke, mold, or car fumes. What increases the risk? You are more likely to develop this condition if you have a family history of allergies or other conditions related to allergies, including: Allergic conjunctivitis. This is inflammation of parts of the eyes and eyelids. Asthma. This condition affects the lungs and makes it hard to breathe. Atopic dermatitis or eczema. This is long term (chronic) inflammation of the skin. Food allergies. What are the signs or symptoms? Symptoms of this condition include: Sneezing or coughing. A stuffy nose (nasal congestion), itchy nose, or nasal discharge. Itchy eyes and tearing of the eyes. A feeling of mucus dripping down the back of your throat (postnasal drip). Trouble sleeping. Tiredness or fatigue. Headache. Sore throat. How is this diagnosed? This condition may be diagnosed with your symptoms, medical history, and physical exam. Your health care provider may check for related conditions, such  as: Asthma. Pink eye. This is eye inflammation caused by infection (conjunctivitis). Ear infection. Upper respiratory infection. This is an infection in the nose, throat, or upper airways. You may also have tests to find out which allergens trigger your symptoms.These may include skin tests or blood tests. How is this treated? There is no cure for this condition, but treatment can help control symptoms. Treatment may include: Taking medicines that block allergy symptoms, such as corticosteroids and antihistamines. Medicine may be given as a shot, nasal spray, or pill. Avoiding any allergens. Being exposed again and again to tiny amounts of allergens to help you build a defense against allergens (immunotherapy). This is done if other treatments have not helped. It may include: Allergy shots. These are injected medicines that have small amounts of allergen in them. Sublingual immunotherapy. This involves taking small doses of a medicine with allergen in it under your tongue. If these treatments do not work, your health care provider may prescribe newer,stronger medicines. Follow these instructions at home: Avoiding allergens Find out what you are allergic to and avoid those allergens. These are some things you can do to help avoid allergens: If you have perennial allergies: Replace carpet with wood, tile, or vinyl flooring. Carpet can trap dander and dust. Do not smoke. Do not allow smoking in your home. Change your heating and air conditioning filters at least once a month. If you have seasonal allergies, take these steps during allergy season: Keep windows closed as much as possible. Plan outdoor activities when pollen counts are lowest. Check pollen counts before you plan outdoor activities. When coming indoors, change clothing and shower before sitting on furniture or bedding. If you have a pet in the house that   produces allergens: Keep the pet out of the bedroom. Vacuum, sweep, and dust  regularly. General instructions Take over-the-counter and prescription medicines only as told by your health care provider. Drink enough fluid to keep your urine pale yellow. Keep all follow-up visits as told by your health care provider. This is important. Where to find more information American Academy of Allergy, Asthma & Immunology: www.aaaai.org Contact a health care provider if: You have a fever. You develop a cough that does not go away. You make whistling sounds when you breathe (wheeze). Your symptoms slow you down or stop you from doing your normal activities each day. Get help right away if: You have shortness of breath. This symptom may represent a serious problem that is an emergency. Do not wait to see if the symptom will go away. Get medical help right away. Call your local emergency services (911 in the [place] Do not drive yourself to the hospital. Summary Allergic rhinitis may be managed by taking medicines as directed and avoiding allergens. If you have seasonal allergies, keep windows closed as much as possible during allergy season. Contact your health care provider if you develop a fever or a cough that does not go away. This information is not intended to replace advice given to you by your health care provider. Make sure you discuss any questions you have with your healthcare provider. Document Revised: 11/19/2019 Document Reviewed: 09/28/2019 Elsevier Patient Education  2022 [employer]  

## 2021-04-03 ENCOUNTER — Other Ambulatory Visit: Payer: Self-pay | Admitting: Family Medicine

## 2021-04-03 DIAGNOSIS — Z1231 Encounter for screening mammogram for malignant neoplasm of breast: Secondary | ICD-10-CM

## 2021-04-04 ENCOUNTER — Ambulatory Visit (INDEPENDENT_AMBULATORY_CARE_PROVIDER_SITE_OTHER): Payer: Medicare Other

## 2021-04-04 VITALS — Ht 67.0 in | Wt 112.0 lb

## 2021-04-04 DIAGNOSIS — Z0000 Encounter for general adult medical examination without abnormal findings: Secondary | ICD-10-CM

## 2021-04-04 NOTE — Patient Instructions (Signed)
Caroline Hodge , Thank you for taking time to come for your Medicare Wellness Visit. I appreciate your ongoing commitment to your health goals. Please review the following plan we discussed and let me know if I can assist you in the future.   Screening recommendations/referrals: Colonoscopy: Done 12/25/2015 - Repeat in 10 years Mammogram: Done 03/10/2020 - Repeat annually - Appointment 04/23/21 Bone Density: Done 09/09/2018 - Repeat every 2 years - get at next visit Recommended yearly ophthalmology/optometry visit for glaucoma screening and checkup Recommended yearly dental visit for hygiene and checkup  Vaccinations: Influenza vaccine: Done 08/02/2020 - Repeat annually Pneumococcal vaccine: Done 08/10/2013 & 09/02/2018 Tdap vaccine: Done 06/15/2011 - Repeat in 10 years (this year) Shingles vaccine: Zostavax done 11/24/2013; due for Shingrix (2 doses 2-6 months apart - more than 90% effective against shingles virus   Covid-19: Done 03/14/20, 04/12/20, 08/09/20, & 02/06/21  Advanced directives: Please bring a copy of your health care power of attorney and living will to the office to be added to your chart at your convenience.  Conditions/risks identified: Aim for 30 minutes of exercise or brisk walking each day, drink 6-8 glasses of water and eat lots of fruits and vegetables.  Next appointment: Follow up in one year for your annual wellness visit    Preventive Care 65 Years and Older, Female Preventive care refers to lifestyle choices and visits with your health care provider that can promote health and wellness. What does preventive care include? A yearly physical exam. This is also called an annual well check. Dental exams once or twice a year. Routine eye exams. Ask your health care provider how often you should have your eyes checked. Personal lifestyle choices, including: Daily care of your teeth and gums. Regular physical activity. Eating a healthy diet. Avoiding tobacco and drug  use. Limiting alcohol use. Practicing safe sex. Taking low-dose aspirin every day. Taking vitamin and mineral supplements as recommended by your health care provider. What happens during an annual well check? The services and screenings done by your health care provider during your annual well check will depend on your age, overall health, lifestyle risk factors, and family history of disease. Counseling  Your health care provider may ask you questions about your: Alcohol use. Tobacco use. Drug use. Emotional well-being. Home and relationship well-being. Sexual activity. Eating habits. History of falls. Memory and ability to understand (cognition). Work and work Astronomer. Reproductive health. Screening  You may have the following tests or measurements: Height, weight, and BMI. Blood pressure. Lipid and cholesterol levels. These may be checked every 5 years, or more frequently if you are over 35 years old. Skin check. Lung cancer screening. You may have this screening every year starting at age 64 if you have a 30-pack-year history of smoking and currently smoke or have quit within the past 15 years. Fecal occult blood test (FOBT) of the stool. You may have this test every year starting at age 19. Flexible sigmoidoscopy or colonoscopy. You may have a sigmoidoscopy every 5 years or a colonoscopy every 10 years starting at age 50. Hepatitis C blood test. Hepatitis B blood test. Sexually transmitted disease (STD) testing. Diabetes screening. This is done by checking your blood sugar (glucose) after you have not eaten for a while (fasting). You may have this done every 1-3 years. Bone density scan. This is done to screen for osteoporosis. You may have this done starting at age 68. Mammogram. This may be done every 1-2 years. Talk to your health  care provider about how often you should have regular mammograms. Talk with your health care provider about your test results, treatment  options, and if necessary, the need for more tests. Vaccines  Your health care provider may recommend certain vaccines, such as: Influenza vaccine. This is recommended every year. Tetanus, diphtheria, and acellular pertussis (Tdap, Td) vaccine. You may need a Td booster every 10 years. Zoster vaccine. You may need this after age 76. Pneumococcal 13-valent conjugate (PCV13) vaccine. One dose is recommended after age 9. Pneumococcal polysaccharide (PPSV23) vaccine. One dose is recommended after age 17. Talk to your health care provider about which screenings and vaccines you need and how often you need them. This information is not intended to replace advice given to you by your health care provider. Make sure you discuss any questions you have with your health care provider. Document Released: 10/27/2015 Document Revised: 06/19/2016 Document Reviewed: 08/01/2015 Elsevier Interactive Patient Education  2017 [place] Prevention in the Home Falls can cause injuries. They can happen to people of all ages. There are many things you can do to make your home safe and to help prevent falls. What can I do on the outside of my home? Regularly fix the edges of walkways and driveways and fix any cracks. Remove anything that might make you trip as you walk through a door, such as a raised step or threshold. Trim any bushes or trees on the path to your home. Use bright outdoor lighting. Clear any walking paths of anything that might make someone trip, such as rocks or tools. Regularly check to see if handrails are loose or broken. Make sure that both sides of any steps have handrails. Any raised decks and porches should have guardrails on the edges. Have any leaves, snow, or ice cleared regularly. Use sand or salt on walking paths during winter. Clean up any spills in your garage right away. This includes oil or grease spills. What can I do in the bathroom? Use night lights. Install grab  bars by the toilet and in the tub and shower. Do not use towel bars as grab bars. Use non-skid mats or decals in the tub or shower. If you need to sit down in the shower, use a plastic, non-slip stool. Keep the floor dry. Clean up any water that spills on the floor as soon as it happens. Remove soap buildup in the tub or shower regularly. Attach bath mats securely with double-sided non-slip rug tape. Do not have throw rugs and other things on the floor that can make you trip. What can I do in the bedroom? Use night lights. Make sure that you have a light by your bed that is easy to reach. Do not use any sheets or blankets that are too big for your bed. They should not hang down onto the floor. Have a firm chair that has side arms. You can use this for support while you get dressed. Do not have throw rugs and other things on the floor that can make you trip. What can I do in the kitchen? Clean up any spills right away. Avoid walking on wet floors. Keep items that you use a lot in easy-to-reach places. If you need to reach something above you, use a strong step stool that has a grab bar. Keep electrical cords out of the way. Do not use floor polish or wax that makes floors slippery. If you must use wax, use non-skid floor wax. Do not have throw  rugs and other things on the floor that can make you trip. What can I do with my stairs? Do not leave any items on the stairs. Make sure that there are handrails on both sides of the stairs and use them. Fix handrails that are broken or loose. Make sure that handrails are as long as the stairways. Check any carpeting to make sure that it is firmly attached to the stairs. Fix any carpet that is loose or worn. Avoid having throw rugs at the top or bottom of the stairs. If you do have throw rugs, attach them to the floor with carpet tape. Make sure that you have a light switch at the top of the stairs and the bottom of the stairs. If you do not have them,  ask someone to add them for you. What else can I do to help prevent falls? Wear shoes that: Do not have high heels. Have rubber bottoms. Are comfortable and fit you well. Are closed at the toe. Do not wear sandals. If you use a stepladder: Make sure that it is fully opened. Do not climb a closed stepladder. Make sure that both sides of the stepladder are locked into place. Ask someone to hold it for you, if possible. Clearly mark and make sure that you can see: Any grab bars or handrails. First and last steps. Where the edge of each step is. Use tools that help you move around (mobility aids) if they are needed. These include: Canes. Walkers. Scooters. Crutches. Turn on the lights when you go into a dark area. Replace any light bulbs as soon as they burn out. Set up your furniture so you have a clear path. Avoid moving your furniture around. If any of your floors are uneven, fix them. If there are any pets around you, be aware of where they are. Review your medicines with your doctor. Some medicines can make you feel dizzy. This can increase your chance of falling. Ask your doctor what other things that you can do to help prevent falls. This information is not intended to replace advice given to you by your health care provider. Make sure you discuss any questions you have with your health care provider. Document Released: 07/27/2009 Document Revised: 03/07/2016 Document Reviewed: 11/04/2014 Elsevier Interactive Patient Education  2017 [employer].

## 2021-04-04 NOTE — Progress Notes (Signed)
Subjective:   Caroline Hodge is a 70 y.o. female who presents for an Initial Medicare Annual Wellness Visit.  Virtual Visit via Telephone Note  I connected with  Caroline Hodge on 04/04/21 at  2:45 PM EDT by telephone and verified that I am speaking with the correct person using two identifiers.  Location: Patient: Home Provider: [facility] Persons participating in the virtual visit: patient/Nurse Health Advisor   I discussed the limitations, risks, security and privacy concerns of performing an evaluation and management service by telephone and the availability of in person appointments. The patient expressed understanding and agreed to proceed.  Interactive audio and video telecommunications were attempted between this nurse and patient, however failed, due to patient having technical difficulties OR patient did not have access to video capability.  We continued and completed visit with audio only.  Some vital signs may be absent or patient reported.   Dola Lunsford E Viraj Liby, LPN   Review of Systems     Cardiac Risk Factors include: advanced age (>34men, >54 women)     Objective:    Today's Vitals   04/04/21 1459  Weight: 112 lb (50.8 kg)  Height: 5\' 7"  (1.702 m)   Body mass index is 17.54 kg/m.  Advanced Directives 04/04/2021 12/02/2017 11/26/2016 12/06/2015 11/23/2015 04/25/2014 04/19/2014  Does Patient Have a Medical Advance Directive? No Yes Yes Yes Yes Patient does not have advance directive;Patient would not like information -  Type of Advance Directive - Healthcare Power of [place];Living will Healthcare Power of [place];Living will Living will;Healthcare Power of Attorney - - -  Copy of Healthcare Power of Attorney in Chart? - - No - copy requested - No - copy requested - -  Would patient like information on creating a medical advance directive? No - Patient declined - - - - - -  Pre-existing out of facility DNR order (yellow form or pink MOST form) - - - - - - No    Current  Medications (verified) Outpatient Encounter Medications as of 04/04/2021  Medication Sig   aspirin EC 81 MG tablet Take 1 tablet (81 mg total) by mouth daily.   Calcium Carb-Cholecalciferol (CALCIUM-VITAMIN D) 500-200 MG-UNIT tablet Take 1 tablet by mouth daily. Reported on 12/06/2015   estradiol (ESTRACE) 0.5 MG tablet Take 1 tablet (0.5 mg total) by mouth daily.   progesterone (PROMETRIUM) 100 MG capsule Take 1 capsule (100 mg total) by mouth daily.   rosuvastatin (CRESTOR) 5 MG tablet Take 1 tablet (5 mg total) by mouth daily.   No facility-administered encounter medications on file as of 04/04/2021.    Allergies (verified) Patient has no known allergies.   History: Past Medical History:  Diagnosis Date   Carotid artery occlusion    Osteopenia    Personal history of kidney stones 2007   Vitamin D deficiency    Past Surgical History:  Procedure Laterality Date   CATARACT EXTRACTION W/PHACO Right 04/25/2014   Procedure: CATARACT EXTRACTION PHACO AND INTRAOCULAR LENS PLACEMENT (IOC);  Surgeon: 04/27/2014, MD;  Location: [facility];  Service: Ophthalmology;  Laterality: Right;  CDE 8.46   CATARACT EXTRACTION W/PHACO Left 05/09/2014   Procedure: CATARACT EXTRACTION PHACO AND INTRAOCULAR LENS PLACEMENT LEFT EYE CDE=6.23;  Surgeon: 05/11/2014, MD;  Location: [facility];  Service: Ophthalmology;  Laterality: Left;   COLONOSCOPY  2006   SKIN GRAFT Left 1975   foot; after foot injury   Family History  Problem Relation Age of Onset   Healthy Mother    Healthy  Father    Colon cancer Neg Hx    Social History   Socioeconomic History   Marital status: Single    Spouse name: Not on file   Number of children: 0   Years of education: Not on file   Highest education level: Not on file  Occupational History   Occupation: retired  Tobacco Use   Smoking status: Never   Smokeless tobacco: Never  Vaping Use   Vaping Use: Never used  Substance and Sexual Activity   Alcohol use: Yes     Alcohol/week: 2.0 standard drinks    Types: 1 Glasses of wine, 1 Standard drinks or equivalent per week   Drug use: No   Sexual activity: Yes  Other Topics Concern   Not on file  Social History Narrative   Lives alone.   Social Determinants of Health   Financial Resource Strain: Low Risk    Difficulty of Paying Living Expenses: Not hard at all  Food Insecurity: No Food Insecurity   Worried About Programme researcher, broadcasting/film/videounning Out of Food in the Last Year: Never true   Ran Out of Food in the Last Year: Never true  Transportation Needs: No Transportation Needs   Lack of Transportation (Medical): No   Lack of Transportation (Non-Medical): No  Physical Activity: Sufficiently Active   Days of Exercise per Week: 5 days   Minutes of Exercise per Session: 30 min  Stress: No Stress Concern Present   Feeling of Stress : Not at all  Social Connections: Moderately Integrated   Frequency of Communication with Friends and Family: More than three times a week   Frequency of Social Gatherings with Friends and Family: More than three times a week   Attends Religious Services: More than 4 times per year   Active Member of [employer] Financial[employer]ubs or Organizations: Yes   Attends Engineer, structuralClub or Organization Meetings: More than 4 times per year   Marital Status: Never married    Tobacco Counseling Counseling given: Not Answered   Clinical Intake:  Pre-visit preparation completed: Yes  Pain : No/denies pain     BMI - recorded: 17.54 Nutritional Status: BMI <19  Underweight Nutritional Risks: None Diabetes: No  How often do you need to have someone help you when you read instructions, pamphlets, or other written materials from your doctor or pharmacy?: 1 - Never  Diabetic? No  Interpreter Needed?: No  Information entered by :: Ayomide Zuleta, LPN   Activities of Daily Living In your present state of health, do you have any difficulty performing the following activities: 04/04/2021  Hearing? N  Vision? N  Difficulty concentrating  or making decisions? N  Walking or climbing stairs? N  Dressing or bathing? N  Doing errands, shopping? N  Preparing Food and eating ? N  Using the Toilet? N  In the past six months, have you accidently leaked urine? N  Do you have problems with loss of bowel control? N  Managing your Medications? N  Managing your Finances? N  Housekeeping or managing your Housekeeping? N  Some recent data might be hidden    Patient Care Team: Dettinger, Elige RadonJoshua A, MD as PCP - General (Family Medicine) Delora FuelJohnson, Brent, OD (Optometry) Early, Kristen Loaderodd F, MD as Consulting Physician (Vascular Surgery)  Indicate any recent Medical Services you may have received from other than [facility] providers in the past year (date may be approximate).     Assessment:   This is a routine wellness examination for Stanton KidneyDebra.  Hearing/Vision screen Hearing Screening -  Comments:: Denies hearing difficulties  Vision Screening - Comments:: Wears eyeglasses - up to date with annual eye exam with [facility] in [place].  Dietary issues and exercise activities discussed: Current Exercise Habits: Home exercise routine, Type of exercise: walking, Time (Minutes): 30, Frequency (Times/Week): 5, Weekly Exercise (Minutes/Week): 150, Intensity: Mild, Exercise limited by: None identified   Goals Addressed             This Visit's Progress    Patient Stated       Just wants to stay healthy        Depression Screen PHQ 2/9 Scores 04/04/2021 09/06/2020 09/08/2019 09/02/2018 09/01/2017 10/28/2016 08/06/2016  PHQ - 2 Score 0 0 0 0 0 0 0    Fall Risk Fall Risk  04/04/2021 09/06/2020 09/08/2019 09/02/2018 09/01/2017  Falls in the past year? 0 0 0 0 No  Number falls in past yr: 0 - - - -  Injury with Fall? 0 - - - -  Risk for fall due to : No Fall Risks - - - -  Follow up Falls prevention discussed - - - -    FALL RISK PREVENTION PERTAINING TO THE HOME:  Any stairs in or around the home? Yes  If so, are there any without handrails? No   Home free of loose throw rugs in walkways, pet beds, electrical cords, etc? Yes  Adequate lighting in your home to reduce risk of falls? Yes   ASSISTIVE DEVICES UTILIZED TO PREVENT FALLS:  Life alert? No  Use of a cane, walker or w/c? No  Grab bars in the bathroom? Yes  Shower chair or bench in shower? Yes  Elevated toilet seat or a handicapped toilet? Yes   TIMED UP AND GO:  Was the test performed? No . Telephonic visit.  Cognitive Function:     6CIT Screen 04/04/2021  What Year? 0 points  What month? 0 points  What time? 0 points  Count back from 20 0 points  Months in reverse 0 points  Repeat phrase 0 points  Total Score 0    Immunizations Immunization History  Administered Date(s) Administered   Fluad Quad(high Dose 65+) 07/26/2019   Influenza Whole 07/14/2012   Influenza, High Dose Seasonal PF 08/12/2018   Influenza,inj,Quad PF,6+ Mos 07/21/2013, 07/29/2014, 08/28/2015, 08/06/2016, 07/28/2017   Influenza-Unspecified 08/02/2020   Moderna Sars-Covid-2 Vaccination 03/14/2020, 04/12/2020, 08/09/2020, 02/06/2021   Pneumococcal Conjugate-13 08/10/2013   Pneumococcal Polysaccharide-23 09/02/2018   Tdap 06/15/2011   Zoster, Live 11/24/2013    TDAP status: Up to date  Flu Vaccine status: Up to date  Pneumococcal vaccine status: Up to date  Covid-19 vaccine status: Completed vaccines  Qualifies for Shingles Vaccine? Yes   Zostavax completed Yes   Shingrix Completed?: No.    Education has been provided regarding the importance of this vaccine. Patient has been advised to call insurance company to determine out of pocket expense if they have not yet received this vaccine. Advised may also receive vaccine at local pharmacy or Health Dept. Verbalized acceptance and understanding.  Screening Tests Health Maintenance  Topic Date Due   Zoster Vaccines- Shingrix (1 of 2) Never done   INFLUENZA VACCINE  05/14/2021   TETANUS/TDAP  06/14/2021   MAMMOGRAM  03/08/2022    COLONOSCOPY (Pts 45-65yrs Insurance coverage will need to be confirmed)  12/24/2025   DEXA SCAN  Completed   COVID-19 Vaccine  Completed   Hepatitis C Screening  Completed   PNA vac Low Risk Adult  Completed  HPV VACCINES  Aged Out   COLON CANCER SCREENING ANNUAL FOBT  Discontinued    Health Maintenance  Health Maintenance Due  Topic Date Due   Zoster Vaccines- Shingrix (1 of 2) Never done    Colorectal cancer screening: Type of screening: Colonoscopy. Completed 12/25/2015. Repeat every 10 years  Mammogram status: Ordered 03/2021. Pt provided with contact info and advised to call to schedule appt.  Has appt 04/23/21  Bone Density status: Completed 09/09/2018. Results reflect: Bone density results: OSTEOPENIA. Repeat every 2 years.  Lung Cancer Screening: (Low Dose CT Chest recommended if Age 32-80 years, 30 pack-year currently smoking OR have quit w/in 15years.) does not qualify.   Additional Screening:  Hepatitis C Screening: does qualify; Completed 09/08/2019  Vision Screening: Recommended annual ophthalmology exams for early detection of glaucoma and other disorders of the eye. Is the patient up to date with their annual eye exam?  Yes  Who is the provider or what is the name of the office in which the patient attends annual eye exams? [facility] If pt is not established with a provider, would they like to be referred to a provider to establish care? No .   Dental Screening: Recommended annual dental exams for proper oral hygiene  Community Resource Referral / Chronic Care Management: CRR required this visit?  No   CCM required this visit?  No      Plan:     I have personally reviewed and noted the following in the patient's chart:   Medical and social history Use of alcohol, tobacco or illicit drugs  Current medications and supplements including opioid prescriptions. Patient is not currently taking opioid prescriptions. Functional ability and  status Nutritional status Physical activity Advanced directives List of other physicians Hospitalizations, surgeries, and ER visits in previous 12 months Vitals Screenings to include cognitive, depression, and falls Referrals and appointments  In addition, I have reviewed and discussed with patient certain preventive protocols, quality metrics, and best practice recommendations. A written personalized care plan for preventive services as well as general preventive health recommendations were provided to patient.     [place] Constable, LPN   04/07/6388   Nurse Notes: None

## 2021-04-23 ENCOUNTER — Other Ambulatory Visit: Payer: Self-pay

## 2021-04-23 ENCOUNTER — Ambulatory Visit
Admission: RE | Admit: 2021-04-23 | Discharge: 2021-04-23 | Disposition: A | Discharge status: Home / Self Care | Payer: Medicare Other | Source: Ambulatory Visit | Attending: Family Medicine | Admitting: Family Medicine

## 2021-04-23 DIAGNOSIS — Z1231 Encounter for screening mammogram for malignant neoplasm of breast: Secondary | ICD-10-CM

## 2021-07-10 DIAGNOSIS — Z23 Encounter for immunization: Secondary | ICD-10-CM | POA: Diagnosis not present

## 2021-08-06 DIAGNOSIS — Z23 Encounter for immunization: Secondary | ICD-10-CM | POA: Diagnosis not present

## 2021-09-10 ENCOUNTER — Encounter: Payer: Self-pay | Admitting: Family Medicine

## 2021-09-10 ENCOUNTER — Ambulatory Visit (INDEPENDENT_AMBULATORY_CARE_PROVIDER_SITE_OTHER): Payer: Medicare Other | Admitting: Family Medicine

## 2021-09-10 ENCOUNTER — Ambulatory Visit (INDEPENDENT_AMBULATORY_CARE_PROVIDER_SITE_OTHER): Payer: Medicare Other

## 2021-09-10 VITALS — BP 135/76 | HR 90 | Ht 67.0 in | Wt 116.0 lb

## 2021-09-10 DIAGNOSIS — M8588 Other specified disorders of bone density and structure, other site: Secondary | ICD-10-CM

## 2021-09-10 DIAGNOSIS — M8580 Other specified disorders of bone density and structure, unspecified site: Secondary | ICD-10-CM | POA: Diagnosis not present

## 2021-09-10 DIAGNOSIS — M85851 Other specified disorders of bone density and structure, right thigh: Secondary | ICD-10-CM | POA: Diagnosis not present

## 2021-09-10 DIAGNOSIS — Z780 Asymptomatic menopausal state: Secondary | ICD-10-CM | POA: Diagnosis not present

## 2021-09-10 DIAGNOSIS — I6523 Occlusion and stenosis of bilateral carotid arteries: Secondary | ICD-10-CM | POA: Diagnosis not present

## 2021-09-10 DIAGNOSIS — E559 Vitamin D deficiency, unspecified: Secondary | ICD-10-CM

## 2021-09-10 DIAGNOSIS — E785 Hyperlipidemia, unspecified: Secondary | ICD-10-CM

## 2021-09-10 DIAGNOSIS — I779 Disorder of arteries and arterioles, unspecified: Secondary | ICD-10-CM | POA: Diagnosis not present

## 2021-09-10 DIAGNOSIS — Z0000 Encounter for general adult medical examination without abnormal findings: Secondary | ICD-10-CM

## 2021-09-10 MED ORDER — ROSUVASTATIN CALCIUM 5 MG PO TABS: 5.0000 mg | ORAL_TABLET | Freq: Every day | ORAL | 3 refills | Status: DC

## 2021-09-10 NOTE — Progress Notes (Signed)
BP 135/76   Pulse 90   Ht _0  (1.702 m)   Wt 116 lb (52.6 kg)   SpO2 97%   BMI 18.17 kg/m    Subjective:   Patient ID: Caroline Hodge, female    DOB: 09/04/1951, 70 y.o.   MRN: [id number]  HPI: Caroline Hodge is a 70 y.o. female presenting on 09/10/2021 for Medical Management of Chronic Issues (CPE-no pap)   HPI Physical exam and recheck of chronic issues. Patient had mammogram earlier in the year and seems to be doing well.  Her Pap smear was normal last time and she does not want to do any further Pap smears.  osteopenia and vitamin D deficiency Fractures or history of fracture: None Medication: Calcium and vitamin D Duration of treatment: 3 years Last bone density scan: 2019 Last T score: -1.9  Hyperlipidemia Patient is coming in for recheck of his hyperlipidemia. The patient is currently taking Crestor. They deny any issues with myalgias or history of liver damage from it. They deny any focal numbness or weakness or chest pain.   Relevant past medical, surgical, family and social history reviewed and updated as indicated. Interim medical history since our last visit reviewed. Allergies and medications reviewed and updated.  Review of Systems  Constitutional:  Negative for chills and fever.  Eyes:  Negative for visual disturbance.  Respiratory:  Negative for chest tightness and shortness of breath.   Cardiovascular:  Negative for chest pain and leg swelling.  Musculoskeletal:  Negative for back pain and gait problem.  Skin:  Negative for rash.  Neurological:  Negative for dizziness, light-headedness and headaches.  Psychiatric/Behavioral:  Negative for agitation and behavioral problems.   All other systems reviewed and are negative.  Per HPI unless specifically indicated above   Allergies as of 09/10/2021   No Known Allergies      Medication List        Accurate as of September 10, 2021 10:07 AM. If you have any questions, ask your nurse or doctor.           STOP taking these medications    estradiol 0.5 MG tablet Commonly known as: ESTRACE Stopped by: Fransisca Kaufmann [name], MD   progesterone 100 MG capsule Commonly known as: PROMETRIUM Stopped by: Worthy Rancher, MD       TAKE these medications    aspirin EC 81 MG tablet Take 1 tablet (81 mg total) by mouth daily.   calcium-vitamin D 500-200 MG-UNIT tablet Take 1 tablet by mouth daily. Reported on 12/06/2015   rosuvastatin 5 MG tablet Commonly known as: CRESTOR Take 1 tablet (5 mg total) by mouth daily.         Objective:   BP 135/76   Pulse 90   Ht _1  (1.702 m)   Wt 116 lb (52.6 kg)   SpO2 97%   BMI 18.17 kg/m   Wt Readings from Last 3 Encounters:  09/10/21 116 lb (52.6 kg)  04/04/21 112 lb (50.8 kg)  03/26/21 115 lb 2 oz (52.2 kg)    Physical Exam Vitals and nursing note reviewed.  Constitutional:      General: She is not in acute distress.    Appearance: She is well-developed. She is not diaphoretic.  HENT:     Right Ear: Tympanic membrane and ear canal normal.     Left Ear: Tympanic membrane and ear canal normal.     Mouth/Throat:     Mouth: Mucous membranes are moist.  Pharynx: No oropharyngeal exudate or posterior oropharyngeal erythema.  Eyes:     Conjunctiva/sclera: Conjunctivae normal.  Cardiovascular:     Rate and Rhythm: Normal rate and regular rhythm.     Heart sounds: Normal heart sounds. No murmur heard. Pulmonary:     Effort: Pulmonary effort is normal. No respiratory distress.     Breath sounds: Normal breath sounds. No wheezing.  Abdominal:     General: Abdomen is flat. Bowel sounds are normal. There is no distension.     Tenderness: There is no abdominal tenderness. There is no right CVA tenderness, left CVA tenderness, guarding or rebound.  Musculoskeletal:        General: No tenderness. Normal range of motion.     Cervical back: Neck supple.  Lymphadenopathy:     Cervical: No cervical adenopathy.  Skin:     General: Skin is warm and dry.     Findings: No rash.  Neurological:     Mental Status: She is alert and oriented to person, place, and time.     Coordination: Coordination normal.  Psychiatric:        Behavior: Behavior normal.      Assessment & Plan:   Problem List Items Addressed This Visit       Cardiovascular and Mediastinum   Carotid artery disease ([place])   Relevant Medications   rosuvastatin (CRESTOR) 5 MG tablet   Other Relevant Orders   CBC with Differential/Platelet     Musculoskeletal and Integument   Osteopenia   Relevant Orders   DG [facility] DEXA     Other   Vitamin D deficiency   Relevant Orders   VITAMIN D 25 Hydroxy (Vit-D Deficiency, Fractures)   Dyslipidemia - Primary   Relevant Medications   rosuvastatin (CRESTOR) 5 MG tablet   Other Relevant Orders   CMP14+EGFR   Lipid panel   Other Visit Diagnoses     Annual physical exam       Postmenopausal       Relevant Orders   DG [facility] DEXA       Continue current medicine, she was able to come down off the hormone placement therapy without any issue.  Will check blood work and do bone density today. Follow up plan: Return in about 1 year (around 09/10/2022), or if symptoms worsen or fail to improve, for Physical and recheck chronic medical issues.  Counseling provided for all of the vaccine components No orders of the defined types were placed in this encounter.   [name], MD [facility] 09/10/2021, 10:07 AM

## 2021-09-11 LAB — CBC WITH DIFFERENTIAL/PLATELET
Basophils Absolute: 0 10*3/uL (ref 0.0–0.2)
Basos: 1 %
EOS (ABSOLUTE): 0.2 10*3/uL (ref 0.0–0.4)
Eos: 3 %
Hematocrit: 40.6 % (ref 34.0–46.6)
Hemoglobin: 13.8 g/dL (ref 11.1–15.9)
Immature Grans (Abs): 0 10*3/uL (ref 0.0–0.1)
Immature Granulocytes: 0 %
Lymphocytes Absolute: 1.5 10*3/uL (ref 0.7–3.1)
Lymphs: 25 %
MCH: 30.5 pg (ref 26.6–33.0)
MCHC: 34 g/dL (ref 31.5–35.7)
MCV: 90 fL (ref 79–97)
Monocytes Absolute: 0.5 10*3/uL (ref 0.1–0.9)
Monocytes: 8 %
Neutrophils Absolute: 4 10*3/uL (ref 1.4–7.0)
Neutrophils: 63 %
Platelets: 240 10*3/uL (ref 150–450)
RBC: 4.53 x10E6/uL (ref 3.77–5.28)
RDW: 12.2 % (ref 11.7–15.4)
WBC: 6.3 10*3/uL (ref 3.4–10.8)

## 2021-09-11 LAB — CMP14+EGFR
ALT: 20 IU/L (ref 0–32)
AST: 22 IU/L (ref 0–40)
Albumin/Globulin Ratio: 2.7 — ABNORMAL HIGH (ref 1.2–2.2)
Albumin: 4.8 g/dL (ref 3.8–4.8)
Alkaline Phosphatase: 51 IU/L (ref 44–121)
BUN/Creatinine Ratio: 21 (ref 12–28)
BUN: 15 mg/dL (ref 8–27)
Bilirubin Total: 1.4 mg/dL — ABNORMAL HIGH (ref 0.0–1.2)
CO2: 25 mmol/L (ref 20–29)
Calcium: 9.5 mg/dL (ref 8.7–10.3)
Chloride: 103 mmol/L (ref 96–106)
Creatinine, Ser: 0.71 mg/dL (ref 0.57–1.00)
Globulin, Total: 1.8 g/dL (ref 1.5–4.5)
Glucose: 89 mg/dL (ref 70–99)
Potassium: 4.5 mmol/L (ref 3.5–5.2)
Sodium: 141 mmol/L (ref 134–144)
Total Protein: 6.6 g/dL (ref 6.0–8.5)
eGFR: 93 mL/min/{1.73_m2} (ref >59)

## 2021-09-11 LAB — VITAMIN D 25 HYDROXY (VIT D DEFICIENCY, FRACTURES): Vit D, 25-Hydroxy: 31.4 ng/mL (ref 30.0–100.0)

## 2021-09-11 LAB — LIPID PANEL
Chol/HDL Ratio: 1.9 ratio (ref 0.0–4.4)
Cholesterol, Total: 144 mg/dL (ref 100–199)
HDL: 75 mg/dL (ref >39)
LDL Chol Calc (NIH): 57 mg/dL (ref 0–99)
Triglycerides: 53 mg/dL (ref 0–149)
VLDL Cholesterol Cal: 12 mg/dL (ref 5–40)

## 2021-12-14 DIAGNOSIS — Z20822 Contact with and (suspected) exposure to covid-19: Secondary | ICD-10-CM | POA: Diagnosis not present

## 2021-12-17 ENCOUNTER — Ambulatory Visit (INDEPENDENT_AMBULATORY_CARE_PROVIDER_SITE_OTHER): Payer: Medicare Other | Admitting: Family Medicine

## 2021-12-17 ENCOUNTER — Encounter: Payer: Self-pay | Admitting: Family Medicine

## 2021-12-17 VITALS — BP 123/63 | HR 81 | Temp 98.0°F | Ht 67.0 in | Wt 115.2 lb

## 2021-12-17 DIAGNOSIS — H938X3 Other specified disorders of ear, bilateral: Secondary | ICD-10-CM

## 2021-12-17 DIAGNOSIS — R0981 Nasal congestion: Secondary | ICD-10-CM

## 2021-12-17 MED ORDER — PREDNISONE 20 MG PO TABS: ORAL_TABLET | ORAL | 0 refills | Status: DC

## 2021-12-17 MED ORDER — FLUTICASONE PROPIONATE 50 MCG/ACT NA SUSP: 1.0000 | Freq: Two times a day (BID) | NASAL | 6 refills | Status: DC | PRN

## 2021-12-17 NOTE — Progress Notes (Signed)
? ?BP 123/63   Pulse 81   Temp 98 ?F (36.7 ?C) (Temporal)   Ht 5\' 7"  (1.702 m)   Wt 115 lb 4 oz (52.3 kg)   BMI 18.05 kg/m?   ? ?Subjective:  ? ?Patient ID: Caroline Hodge, female    DOB: 30-Jul-1951, 71 y.o.   MRN: [id number] ? ?HPI: ?Caroline Hodge is a 71 y.o. female presenting on 12/17/2021 for Hearing Problem ? ? ?HPI ?Patient is coming in with complaints of hearing problem and she just feels like it is muffled in both of her ears.  She says is been going on for couple weeks.  She did go on some flights and cruises but got back about a month ago, did not start right away but she did not know if it was associated with that or not.  She denies any pain from her ears or drainage.  She does feels like everything is muffled, almost like she has her fingers in her ear plugging them.  She has used a little bit of Tylenol Sinus over the past couple days but has not noted a difference. ? ?Relevant past medical, surgical, family and social history reviewed and updated as indicated. Interim medical history since our last visit reviewed. ?Allergies and medications reviewed and updated. ? ?Review of Systems  ?Constitutional:  Negative for chills and fever.  ?HENT:  Positive for congestion, hearing loss and sinus pressure. Negative for ear discharge, ear pain, postnasal drip, rhinorrhea, sinus pain, sneezing and sore throat.   ?Eyes:  Negative for pain, redness and visual disturbance.  ?Respiratory:  Negative for chest tightness and shortness of breath.   ?Cardiovascular:  Negative for chest pain and leg swelling.  ?Genitourinary:  Negative for difficulty urinating and dysuria.  ?Musculoskeletal:  Negative for back pain and gait problem.  ?Skin:  Negative for rash.  ?Neurological:  Negative for light-headedness and headaches.  ?Psychiatric/Behavioral:  Negative for agitation and behavioral problems.   ?All other systems reviewed and are negative. ? ?Per HPI unless specifically indicated above ? ? ?Allergies as of 12/17/2021    ?No Known Allergies ?  ? ?  ?Medication List  ?  ? ?  ? Accurate as of December 17, 2021 11:56 AM. If you have any questions, ask your nurse or doctor.  ?  ?  ? ?  ? ?aspirin EC 81 MG tablet ?Take 1 tablet (81 mg total) by mouth daily. ?  ?calcium-vitamin D 500-200 MG-UNIT tablet ?Take 1 tablet by mouth daily. Reported on 12/06/2015 ?  ?fluticasone 50 MCG/ACT nasal spray ?Commonly known as: FLONASE ?Place 1 spray into both nostrils 2 (two) times daily as needed for allergies or rhinitis. ?Started by: 12/08/2015, MD ?  ?predniSONE 20 MG tablet ?Commonly known as: DELTASONE ?2 po at same time daily for 5 days ?Started by: Nils Pyle, MD ?  ?rosuvastatin 5 MG tablet ?Commonly known as: CRESTOR ?Take 1 tablet (5 mg total) by mouth daily. ?  ? ?  ? ? ? ?Objective:  ? ?BP 123/63   Pulse 81   Temp 98 ?F (36.7 ?C) (Temporal)   Ht 5\' 7"  (1.702 m)   Wt 115 lb 4 oz (52.3 kg)   BMI 18.05 kg/m?   ?Wt Readings from Last 3 Encounters:  ?12/17/21 115 lb 4 oz (52.3 kg)  ?09/10/21 116 lb (52.6 kg)  ?04/04/21 112 lb (50.8 kg)  ?  ?Physical Exam ?Vitals and nursing note reviewed.  ?Constitutional:   ?  General: She is not in acute distress. ?   Appearance: She is well-developed. She is not diaphoretic.  ?HENT:  ?   Right Ear: Ear canal normal. Tympanic membrane is bulging. Tympanic membrane is not injected, scarred, perforated, erythematous or retracted.  ?   Left Ear: Ear canal normal. Tympanic membrane is bulging. Tympanic membrane is not injected, scarred, perforated, erythematous or retracted.  ?Eyes:  ?   Conjunctiva/sclera: Conjunctivae normal.  ?Skin: ?   General: Skin is warm and dry.  ?   Findings: No rash.  ?Neurological:  ?   Mental Status: She is alert and oriented to person, place, and time.  ?   Coordination: Coordination normal.  ?Psychiatric:     ?   Behavior: Behavior normal.  ? ? ? ? ?Assessment & Plan:  ? ?Problem List Items Addressed This Visit   ?None ?Visit Diagnoses   ? ? Pressure sensation in  both ears    -  Primary  ? Relevant Medications  ? fluticasone (FLONASE) 50 MCG/ACT nasal spray  ? predniSONE (DELTASONE) 20 MG tablet  ? Sinus congestion      ? Relevant Medications  ? fluticasone (FLONASE) 50 MCG/ACT nasal spray  ? predniSONE (DELTASONE) 20 MG tablet  ? ?  ?  ?We will give short course of prednisone and Flonase to see if we can reduce the pressure in your ears.  If not improved then we will readdress and reassess in the future. ?Follow up plan: ?Return if symptoms worsen or fail to improve. ? ?Counseling provided for all of the vaccine components ?No orders of the defined types were placed in this encounter. ? ? ?Arville Care, MD ?[facility] ?12/17/2021, 11:56 AM ? ? ? ? ?

## 2021-12-28 DIAGNOSIS — Z20828 Contact with and (suspected) exposure to other viral communicable diseases: Secondary | ICD-10-CM | POA: Diagnosis not present

## 2022-01-02 DIAGNOSIS — Z20822 Contact with and (suspected) exposure to covid-19: Secondary | ICD-10-CM | POA: Diagnosis not present

## 2022-01-12 DIAGNOSIS — Z20828 Contact with and (suspected) exposure to other viral communicable diseases: Secondary | ICD-10-CM | POA: Diagnosis not present

## 2022-01-28 DIAGNOSIS — Z20822 Contact with and (suspected) exposure to covid-19: Secondary | ICD-10-CM | POA: Diagnosis not present

## 2022-02-14 DIAGNOSIS — Z20822 Contact with and (suspected) exposure to covid-19: Secondary | ICD-10-CM | POA: Diagnosis not present

## 2022-02-18 DIAGNOSIS — Z20822 Contact with and (suspected) exposure to covid-19: Secondary | ICD-10-CM | POA: Diagnosis not present

## 2022-04-05 ENCOUNTER — Ambulatory Visit (INDEPENDENT_AMBULATORY_CARE_PROVIDER_SITE_OTHER): Payer: Medicare Other

## 2022-04-05 VITALS — Wt 115.0 lb

## 2022-04-05 DIAGNOSIS — Z0000 Encounter for general adult medical examination without abnormal findings: Secondary | ICD-10-CM | POA: Diagnosis not present

## 2022-04-05 NOTE — Progress Notes (Signed)
Subjective:   Caroline Hodge is a 71 y.o. female who presents for Medicare Annual (Subsequent) preventive examination.  Virtual Visit via Telephone Note  I connected with  [name] on 04/05/22 at  2:45 PM EDT by telephone and verified that I am speaking with the correct person using two identifiers.  Location: Patient: Home Provider: [facility] Persons participating in the virtual visit: patient/Nurse Health Advisor   I discussed the limitations, risks, security and privacy concerns of performing an evaluation and management service by telephone and the availability of in person appointments. The patient expressed understanding and agreed to proceed.  Interactive audio and video telecommunications were attempted between this nurse and patient, however failed, due to patient having technical difficulties OR patient did not have access to video capability.  We continued and completed visit with audio only.  Some vital signs may be absent or patient reported.   Shannan Slinker E Verble Styron, LPN   Review of Systems     Cardiac Risk Factors include: advanced age (>93men, >95 women);dyslipidemia     Objective:    Today's Vitals   04/05/22 1445  Weight: 115 lb (52.2 kg)   Body mass index is 18.01 kg/m.     04/05/2022    2:49 PM 04/04/2021    3:09 PM 12/02/2017   10:51 AM 11/26/2016   10:25 AM 12/06/2015    2:25 PM 11/23/2015    8:39 AM 04/25/2014    6:53 AM  Advanced Directives  Does Patient Have a Medical Advance Directive? Yes No Yes Yes Yes Yes Patient does not have advance directive;Patient would not like information  Type of Public librarian Power of [place];Living will  Healthcare Power of [place];Living will Healthcare Power of [place];Living will Living will;Healthcare Power of Attorney    Copy of Healthcare Power of Attorney in Chart? No - copy requested   No - copy requested  No - copy requested   Would patient like information on creating a medical advance directive?   No - Patient declined         Current Medications (verified) Outpatient Encounter Medications as of 04/05/2022  Medication Sig   aspirin EC 81 MG tablet Take 1 tablet (81 mg total) by mouth daily.   Calcium Carb-Cholecalciferol (CALCIUM-VITAMIN D) 500-200 MG-UNIT tablet Take 1 tablet by mouth daily. Reported on 12/06/2015   fluticasone (FLONASE) 50 MCG/ACT nasal spray Place 1 spray into both nostrils 2 (two) times daily as needed for allergies or rhinitis.   rosuvastatin (CRESTOR) 5 MG tablet Take 1 tablet (5 mg total) by mouth daily.   [DISCONTINUED] predniSONE (DELTASONE) 20 MG tablet 2 po at same time daily for 5 days (Patient not taking: Reported on 04/05/2022)   No facility-administered encounter medications on file as of 04/05/2022.    Allergies (verified) Patient has no known allergies.   History: Past Medical History:  Diagnosis Date   Carotid artery occlusion    Osteopenia    Personal history of kidney stones 2007   Vitamin D deficiency    Past Surgical History:  Procedure Laterality Date   CATARACT EXTRACTION W/PHACO Right 04/25/2014   Procedure: CATARACT EXTRACTION PHACO AND INTRAOCULAR LENS PLACEMENT (IOC);  Surgeon: Gemma Payor, MD;  Location: [facility];  Service: Ophthalmology;  Laterality: Right;  CDE 8.46   CATARACT EXTRACTION W/PHACO Left 05/09/2014   Procedure: CATARACT EXTRACTION PHACO AND INTRAOCULAR LENS PLACEMENT LEFT EYE CDE=6.23;  Surgeon: Gemma Payor, MD;  Location: [facility];  Service: Ophthalmology;  Laterality: Left;   COLONOSCOPY  2006   SKIN GRAFT Left 1975   foot; after foot injury   Family History  Problem Relation Age of Onset   Healthy Mother    Healthy Father    Colon cancer Neg Hx    Social History   Socioeconomic History   Marital status: Single    Spouse name: Not on file   Number of children: 0   Years of education: Not on file   Highest education level: Not on file  Occupational History   Occupation: retired  Tobacco Use   Smoking status:  Never   Smokeless tobacco: Never  Vaping Use   Vaping Use: Never used  Substance and Sexual Activity   Alcohol use: Yes    Alcohol/week: 2.0 standard drinks of alcohol    Types: 1 Glasses of wine, 1 Standard drinks or equivalent per week   Drug use: No   Sexual activity: Yes  Other Topics Concern   Not on file  Social History Narrative   Lives alone.   Social Determinants of Health   Financial Resource Strain: Low Risk  (04/05/2022)   Overall Financial Resource Strain (CARDIA)    Difficulty of Paying Living Expenses: Not hard at all  Food Insecurity: No Food Insecurity (04/05/2022)   Hunger Vital Sign    Worried About Running Out of Food in the Last Year: Never true    Ran Out of Food in the Last Year: Never true  Transportation Needs: No Transportation Needs (04/05/2022)   PRAPARE - Administrator, Civil Service (Medical): No    Lack of Transportation (Non-Medical): No  Physical Activity: Sufficiently Active (04/05/2022)   Exercise Vital Sign    Days of Exercise per Week: 5 days    Minutes of Exercise per Session: 30 min  Stress: No Stress Concern Present (04/05/2022)   [name] of Occupational Health - Occupational Stress Questionnaire    Feeling of Stress : Not at all  Social Connections: Moderately Integrated (04/05/2022)   Social Connection and Isolation Panel [NHANES]    Frequency of Communication with Friends and Family: More than three times a week    Frequency of Social Gatherings with Friends and Family: More than three times a week    Attends Religious Services: More than 4 times per year    Active Member of [employer] or Organizations: Yes    Attends Engineer, structural: More than 4 times per year    Marital Status: Never married    Tobacco Counseling Counseling given: Not Answered   Clinical Intake:  Pre-visit preparation completed: Yes  Pain : No/denies pain     BMI - recorded: 18.01 Nutritional Status: BMI <19   Underweight Nutritional Risks: None Diabetes: No  How often do you need to have someone help you when you read instructions, pamphlets, or other written materials from your doctor or pharmacy?: 1 - Never  Diabetic? no  Interpreter Needed?: No  Information entered by :: Eloina Ergle, LPN   Activities of Daily Living    04/05/2022    2:49 PM  In your present state of health, do you have any difficulty performing the following activities:  Hearing? 0  Vision? 0  Difficulty concentrating or making decisions? 0  Walking or climbing stairs? 0  Dressing or bathing? 0  Doing errands, shopping? 0  Preparing Food and eating ? N  Using the Toilet? N  In the past six months, have you accidently leaked urine? N  Do you have problems  with loss of bowel control? N  Managing your Medications? N  Managing your Finances? N  Housekeeping or managing your Housekeeping? N    Patient Care Team: Dettinger, Elige Radon, MD as PCP - General (Family Medicine) Delora Fuel, OD (Optometry) Early, Kristen Loader, MD as Consulting Physician (Vascular Surgery)  Indicate any recent Medical Services you may have received from other than [facility] providers in the past year (date may be approximate).     Assessment:   This is a routine wellness examination for Khanh.  Hearing/Vision screen Hearing Screening - Comments:: Denies hearing difficulties   Vision Screening - Comments:: Wears rx glasses - up to date with routine eye exams with [facility]  Dietary issues and exercise activities discussed: Current Exercise Habits: Home exercise routine, Type of exercise: walking, Time (Minutes): 30, Frequency (Times/Week): 5, Weekly Exercise (Minutes/Week): 150, Intensity: Mild, Exercise limited by: None identified   Goals Addressed             This Visit's Progress    Patient Stated   On track    Just wants to stay healthy and independent       Depression Screen    04/05/2022    2:48 PM 12/17/2021   11:36  AM 09/10/2021   10:02 AM 04/04/2021    3:03 PM 09/06/2020   10:36 AM 09/08/2019   10:02 AM 09/02/2018   11:04 AM  PHQ 2/9 Scores  PHQ - 2 Score 0 0 0 0 0 0 0  PHQ- 9 Score  0         Fall Risk    04/05/2022    2:47 PM 12/17/2021   11:36 AM 09/10/2021   10:02 AM 04/04/2021    3:09 PM 09/06/2020   10:36 AM  Fall Risk   Falls in the past year? 0 0 0 0 0  Number falls in past yr: 0   0   Injury with Fall? 0   0   Risk for fall due to : No Fall Risks   No Fall Risks   Follow up Falls prevention discussed   Falls prevention discussed     FALL RISK PREVENTION PERTAINING TO THE HOME:  Any stairs in or around the home? Yes  If so, are there any without handrails? No  Home free of loose throw rugs in walkways, pet beds, electrical cords, etc? Yes  Adequate lighting in your home to reduce risk of falls? Yes   ASSISTIVE DEVICES UTILIZED TO PREVENT FALLS:  Life alert? No  Use of a cane, walker or w/c? No  Grab bars in the bathroom? Yes  Shower chair or bench in shower? Yes  Elevated toilet seat or a handicapped toilet? Yes   TIMED UP AND GO:  Was the test performed? No . Telephonic visit  Cognitive Function:        04/05/2022    2:49 PM 04/04/2021    3:04 PM  6CIT Screen  What Year? 0 points 0 points  What month? 0 points 0 points  What time? 0 points 0 points  Count back from 20 0 points 0 points  Months in reverse 0 points 0 points  Repeat phrase 0 points 0 points  Total Score 0 points 0 points    Immunizations Immunization History  Administered Date(s) Administered   Fluad Quad(high Dose 65+) 07/26/2019   Influenza Whole 07/14/2012   Influenza, High Dose Seasonal PF 08/12/2018   Influenza,inj,Quad PF,6+ Mos 07/21/2013, 07/29/2014, 08/28/2015, 08/06/2016, 07/28/2017  Influenza-Unspecified 08/02/2020, 07/10/2021   Moderna Covid-19 Vaccine Bivalent Booster 79yrs & up 07/31/2021, 03/05/2022   Moderna Sars-Covid-2 Vaccination 03/14/2020, 04/12/2020, 08/09/2020,  02/06/2021   Pneumococcal Conjugate-13 08/10/2013   Pneumococcal Polysaccharide-23 09/02/2018   Tdap 06/15/2011   Zoster Recombinat (Shingrix) 01/01/2022, 04/02/2022   Zoster, Live 11/24/2013    TDAP status: Due, Education has been provided regarding the importance of this vaccine. Advised may receive this vaccine at local pharmacy or Health Dept. Aware to provide a copy of the vaccination record if obtained from local pharmacy or Health Dept. Verbalized acceptance and understanding.  Flu Vaccine status: Up to date  Pneumococcal vaccine status: Up to date  Covid-19 vaccine status: Completed vaccines  Qualifies for Shingles Vaccine? Yes   Zostavax completed Yes   Shingrix Completed?: Yes  Screening Tests Health Maintenance  Topic Date Due   TETANUS/TDAP  09/10/2022 (Originally 06/14/2021)   INFLUENZA VACCINE  05/14/2022   MAMMOGRAM  04/24/2023   DEXA SCAN  09/11/2023   COLONOSCOPY (Pts 45-51yrs Insurance coverage will need to be confirmed)  12/24/2025   Pneumonia Vaccine 28+ Years old  Completed   COVID-19 Vaccine  Completed   Hepatitis C Screening  Completed   Zoster Vaccines- Shingrix  Completed   HPV VACCINES  Aged Out   COLON CANCER SCREENING ANNUAL FOBT  Discontinued    Health Maintenance  There are no preventive care reminders to display for this patient.   Colorectal cancer screening: Type of screening: Colonoscopy. Completed 12/25/2015. Repeat every 10 years  Mammogram status: Completed 04/23/2021. Repeat every year She has appt 05/06/22  Bone Density status: Completed 09/10/2021. Results reflect: Bone density results: OSTEOPENIA. Repeat every 2 years.  Lung Cancer Screening: (Low Dose CT Chest recommended if Age 39-80 years, 30 pack-year currently smoking OR have quit w/in 15years.) does not qualify.   Additional Screening:  Hepatitis C Screening: does qualify; Completed 09/08/2019  Vision Screening: Recommended annual ophthalmology exams for early detection of  glaucoma and other disorders of the eye. Is the patient up to date with their annual eye exam?  Yes  Who is the provider or what is the name of the office in which the patient attends annual eye exams? [facility] If pt is not established with a provider, would they like to be referred to a provider to establish care? No .   Dental Screening: Recommended annual dental exams for proper oral hygiene  Community Resource Referral / Chronic Care Management: CRR required this visit?  No   CCM required this visit?  No      Plan:     I have personally reviewed and noted the following in the patient's chart:   Medical and social history Use of alcohol, tobacco or illicit drugs  Current medications and supplements including opioid prescriptions.  Functional ability and status Nutritional status Physical activity Advanced directives List of other physicians Hospitalizations, surgeries, and ER visits in previous 12 months Vitals Screenings to include cognitive, depression, and falls Referrals and appointments  In addition, I have reviewed and discussed with patient certain preventive protocols, quality metrics, and best practice recommendations. A written personalized care plan for preventive services as well as general preventive health recommendations were provided to patient.   Due to this being a telephonic visit, the after visit summary with patients personalized plan was offered to patient via mail or my-chart. Patient would like to access on my-chart/   [place] Constable, LPN   [id number]   Nurse Notes: None

## 2022-04-23 ENCOUNTER — Other Ambulatory Visit: Payer: Self-pay | Admitting: Family Medicine

## 2022-04-23 DIAGNOSIS — Z1231 Encounter for screening mammogram for malignant neoplasm of breast: Secondary | ICD-10-CM

## 2022-04-25 ENCOUNTER — Other Ambulatory Visit: Payer: Self-pay | Admitting: *Deleted

## 2022-04-25 DIAGNOSIS — I6523 Occlusion and stenosis of bilateral carotid arteries: Secondary | ICD-10-CM

## 2022-05-01 ENCOUNTER — Encounter: Payer: Self-pay | Admitting: Vascular Surgery

## 2022-05-01 ENCOUNTER — Ambulatory Visit (INDEPENDENT_AMBULATORY_CARE_PROVIDER_SITE_OTHER): Payer: Medicare Other | Admitting: Vascular Surgery

## 2022-05-01 ENCOUNTER — Ambulatory Visit (INDEPENDENT_AMBULATORY_CARE_PROVIDER_SITE_OTHER): Payer: Medicare Other

## 2022-05-01 VITALS — BP 135/70 | HR 80 | Temp 97.2°F | Resp 16 | Ht 67.0 in | Wt 113.6 lb

## 2022-05-01 DIAGNOSIS — I6523 Occlusion and stenosis of bilateral carotid arteries: Secondary | ICD-10-CM

## 2022-05-01 NOTE — Progress Notes (Signed)
[facility] [place]  Patient name: Caroline Hodge MRN: [id number] DOB: [birth year] Sex: female  REASON FOR VISIT: Continued follow-up bilateral carotid stenosis  HPI: Caroline Hodge is a 71 y.o. female here today for follow-up.  Had discovery of carotid stenosis and evaluation of right carotid bruit.  She has no prior history of transient ischemic, aphasia, amaurosis fugax or stroke.  She remains otherwise healthy.  Has never smoked cigarettes.  Past Medical History:  Diagnosis Date   Carotid artery occlusion    Osteopenia    Personal history of kidney stones 2007   Vitamin D deficiency     Family History  Problem Relation Age of Onset   Healthy Mother    Healthy Father    Colon cancer Neg Hx     SOCIAL HISTORY: Social History   Tobacco Use   Smoking status: Never    Passive exposure: Never   Smokeless tobacco: Never  Substance Use Topics   Alcohol use: Yes    Alcohol/week: 2.0 standard drinks of alcohol    Types: 1 Glasses of wine, 1 Standard drinks or equivalent per week    No Known Allergies  Current Outpatient Medications  Medication Sig Dispense Refill   aspirin EC 81 MG tablet Take 1 tablet (81 mg total) by mouth daily. 100 tablet 11   Calcium Carb-Cholecalciferol (CALCIUM-VITAMIN D) 500-200 MG-UNIT tablet Take 1 tablet by mouth daily. Reported on 12/06/2015 90 tablet 3   rosuvastatin (CRESTOR) 5 MG tablet Take 1 tablet (5 mg total) by mouth daily. 90 tablet 3   fluticasone (FLONASE) 50 MCG/ACT nasal spray Place 1 spray into both nostrils 2 (two) times daily as needed for allergies or rhinitis. (Patient not taking: Reported on 05/01/2022) 16 g 6   No current facility-administered medications for this visit.    REVIEW OF SYSTEMS:  [X]  denotes positive finding, [ ]  denotes negative finding Cardiac  Comments:  Chest pain or chest pressure:    Shortness of breath upon exertion:    Short of breath when  lying flat:    Irregular heart rhythm:        Vascular    Pain in calf, thigh, or hip brought on by ambulation:    Pain in feet at night that wakes you up from your sleep:     Blood clot in your veins:    Leg swelling:           PHYSICAL EXAM: Vitals:   05/01/22 1510  BP: 135/70  Pulse: 80  Resp: 16  Temp: (!) 97.2 F (36.2 C)  TempSrc: Temporal  SpO2: 100%  Weight: 113 lb 9.6 oz (51.5 kg)  Height: 5\' 7"  (1.702 m)    GENERAL: The patient is a well-nourished female, in no acute distress. The vital signs are documented above. CARDIOVASCULAR: She does have a soft right carotid bruit and no bruit on the left.  2+ radial pulses bilaterally PULMONARY: There is good air exchange  MUSCULOSKELETAL: There are no major deformities or cyanosis. NEUROLOGIC: No focal weakness or paresthesias are detected. SKIN: There are no ulcers or rashes noted. PSYCHIATRIC: The patient has a normal affect.  DATA:  Carotid duplex today was reviewed with the patient.  She has 40- 59% internal carotid artery stenosis at the bifurcation bilaterally which is unchanged from her study 1 year ago  MEDICAL ISSUES: Moderate bilateral carotid disease.  She is appropriately treated with cholesterol-lowering agent and aspirin.  I again reviewed symptoms of symptomatic carotid  disease and she knows to report immediately should this occur.  Otherwise we will see her again in 1 year with repeat carotid duplex    Larina Earthly, MD FACS [facility] [phone]  Note: Portions of this report may have been transcribed using voice recognition software.  Every effort has been made to ensure accuracy; however, inadvertent computerized transcription errors may still be present.

## 2022-05-06 ENCOUNTER — Ambulatory Visit
Admission: RE | Admit: 2022-05-06 | Discharge: 2022-05-06 | Disposition: A | Discharge status: Home / Self Care | Payer: Medicare Other | Source: Ambulatory Visit | Attending: Family Medicine | Admitting: Family Medicine

## 2022-05-06 DIAGNOSIS — Z1231 Encounter for screening mammogram for malignant neoplasm of breast: Secondary | ICD-10-CM

## 2022-07-11 DIAGNOSIS — Z23 Encounter for immunization: Secondary | ICD-10-CM | POA: Diagnosis not present

## 2022-07-23 DIAGNOSIS — Z23 Encounter for immunization: Secondary | ICD-10-CM | POA: Diagnosis not present

## 2022-09-11 ENCOUNTER — Ambulatory Visit (INDEPENDENT_AMBULATORY_CARE_PROVIDER_SITE_OTHER): Payer: Medicare Other | Admitting: Family Medicine

## 2022-09-11 ENCOUNTER — Encounter: Payer: Self-pay | Admitting: Family Medicine

## 2022-09-11 VITALS — BP 144/74 | HR 86 | Ht 67.0 in | Wt 117.0 lb

## 2022-09-11 DIAGNOSIS — I779 Disorder of arteries and arterioles, unspecified: Secondary | ICD-10-CM

## 2022-09-11 DIAGNOSIS — I6523 Occlusion and stenosis of bilateral carotid arteries: Secondary | ICD-10-CM | POA: Diagnosis not present

## 2022-09-11 DIAGNOSIS — F4323 Adjustment disorder with mixed anxiety and depressed mood: Secondary | ICD-10-CM

## 2022-09-11 DIAGNOSIS — M8580 Other specified disorders of bone density and structure, unspecified site: Secondary | ICD-10-CM | POA: Diagnosis not present

## 2022-09-11 DIAGNOSIS — Z0000 Encounter for general adult medical examination without abnormal findings: Secondary | ICD-10-CM | POA: Diagnosis not present

## 2022-09-11 DIAGNOSIS — E559 Vitamin D deficiency, unspecified: Secondary | ICD-10-CM

## 2022-09-11 DIAGNOSIS — E785 Hyperlipidemia, unspecified: Secondary | ICD-10-CM

## 2022-09-11 MED ORDER — SERTRALINE HCL 50 MG PO TABS: 50.0000 mg | ORAL_TABLET | Freq: Every day | ORAL | 2 refills | Status: DC

## 2022-09-11 MED ORDER — ROSUVASTATIN CALCIUM 5 MG PO TABS: 5.0000 mg | ORAL_TABLET | Freq: Every day | ORAL | 3 refills | Status: DC

## 2022-09-11 NOTE — Progress Notes (Signed)
BP (!) 144/74   Pulse 86   Ht _0  (1.702 m)   Wt 117 lb (53.1 kg)   SpO2 95%   BMI 18.32 kg/m    Subjective:   Patient ID: Caroline Hodge, female    DOB: 1951-07-20, 71 y.o.   MRN: [id number]  HPI: Caroline Hodge is a 71 y.o. female presenting on 09/11/2022 for Medical Management of Chronic Issues (CPE- no pap/breast exam)   HPI Physical exam Patient denies any chest pain, shortness of breath, headaches or vision issues, abdominal complaints, diarrhea, nausea, vomiting, or joint issues.   Hyperlipidemia Patient is coming in for recheck of his hyperlipidemia. The patient is currently taking Crestor. They deny any issues with myalgias or history of liver damage from it. They deny any focal numbness or weakness or chest pain.   Osteoporosis/osteopenia Fractures or history of fracture: None Medication: Calcium and vitamin D Duration of treatment: At least 5 years Last bone density scan: 09/10/2021 Last T score: -1.8  Anxiety depression and stress Patient struggled with anxiety depression and stress of being a caregiver.  Her mother's health is struggling and she has a lot of pains and constantly saying that she wishes she would just be dead and for her to hear that constantly, mother started away on her and causing her a lot of stress and she would like to try something to help her.  She denies any suicidal ideations or thoughts of hurting self but is more just the situation is just overwhelming her.  Relevant past medical, surgical, family and social history reviewed and updated as indicated. Interim medical history since our last visit reviewed. Allergies and medications reviewed and updated.  Review of Systems  Constitutional:  Negative for chills and fever.  HENT:  Negative for congestion, ear discharge, ear pain and tinnitus.   Eyes:  Negative for pain, redness and visual disturbance.  Respiratory:  Negative for cough, chest tightness, shortness of breath and wheezing.    Cardiovascular:  Negative for chest pain, palpitations and leg swelling.  Gastrointestinal:  Negative for abdominal pain, blood in stool, constipation and diarrhea.  Genitourinary:  Negative for difficulty urinating, dysuria and hematuria.  Musculoskeletal:  Negative for back pain, gait problem and myalgias.  Skin:  Negative for rash.  Neurological:  Negative for dizziness, weakness, light-headedness and headaches.  Psychiatric/Behavioral:  Positive for dysphoric mood. Negative for agitation, behavioral problems, self-injury, sleep disturbance and suicidal ideas. The patient is nervous/anxious.   All other systems reviewed and are negative.   Per HPI unless specifically indicated above   Allergies as of 09/11/2022   No Known Allergies      Medication List        Accurate as of September 11, 2022 10:50 AM. If you have any questions, ask your nurse or doctor.          aspirin EC 81 MG tablet Take 1 tablet (81 mg total) by mouth daily.   calcium-vitamin D 500-200 MG-UNIT tablet Take 1 tablet by mouth daily. Reported on 12/06/2015   fluticasone 50 MCG/ACT nasal spray Commonly known as: FLONASE Place 1 spray into both nostrils 2 (two) times daily as needed for allergies or rhinitis.   rosuvastatin 5 MG tablet Commonly known as: CRESTOR Take 1 tablet (5 mg total) by mouth daily.   sertraline 50 MG tablet Commonly known as: Zoloft Take 1 tablet (50 mg total) by mouth daily. Started by: Worthy Rancher, MD  Objective:   BP (!) 144/74   Pulse 86   Ht _0  (1.702 m)   Wt 117 lb (53.1 kg)   SpO2 95%   BMI 18.32 kg/m   Wt Readings from Last 3 Encounters:  09/11/22 117 lb (53.1 kg)  05/01/22 113 lb 9.6 oz (51.5 kg)  04/05/22 115 lb (52.2 kg)    Physical Exam Vitals and nursing note reviewed.  Constitutional:      General: She is not in acute distress.    Appearance: She is well-developed. She is not diaphoretic.  Eyes:     Conjunctiva/sclera:  Conjunctivae normal.  Cardiovascular:     Rate and Rhythm: Normal rate and regular rhythm.     Heart sounds: Normal heart sounds. No murmur heard. Pulmonary:     Effort: Pulmonary effort is normal. No respiratory distress.     Breath sounds: Normal breath sounds. No wheezing.  Musculoskeletal:        General: No swelling or tenderness. Normal range of motion.  Skin:    General: Skin is warm and dry.     Findings: No rash.  Neurological:     Mental Status: She is alert and oriented to person, place, and time.     Coordination: Coordination normal.  Psychiatric:        Behavior: Behavior normal.       Assessment & Plan:   Problem List Items Addressed This Visit       Cardiovascular and Mediastinum   Carotid artery disease (HCC)   Relevant Medications   rosuvastatin (CRESTOR) 5 MG tablet     Musculoskeletal and Integument   Osteopenia   Relevant Orders   CBC with Differential/Platelet     Other   Dyslipidemia - Primary   Relevant Medications   rosuvastatin (CRESTOR) 5 MG tablet   Other Relevant Orders   CMP14+EGFR   Lipid panel   Vitamin D deficiency   Relevant Orders   VITAMIN D 25 Hydroxy (Vit-D Deficiency, Fractures)   Other Visit Diagnoses     Annual physical exam       Relevant Orders   CBC with Differential/Platelet   CMP14+EGFR   Lipid panel   VITAMIN D 25 Hydroxy (Vit-D Deficiency, Fractures)   Adjustment reaction with anxiety and depression       Relevant Medications   sertraline (ZOLOFT) 50 MG tablet       Will start Zoloft to see if that helps, will recheck and see how she is doing in a month or 2.  Continue Crestor and will do blood work today. Follow up plan: Return if symptoms worsen or fail to improve, for 1 to 47-monthrecheck on anxiety depression, can be virtual.  Counseling provided for all of the vaccine components Orders Placed This Encounter  Procedures   CBC with Differential/Platelet   CMP14+EGFR   Lipid panel   VITAMIN D 25  Hydroxy (Vit-D Deficiency, Fractures)    JCaryl Pina MD [facility] 09/11/2022, 10:50 AM

## 2022-09-12 LAB — CBC WITH DIFFERENTIAL/PLATELET
Basophils Absolute: 0 10*3/uL (ref 0.0–0.2)
Basos: 0 %
EOS (ABSOLUTE): 0.1 10*3/uL (ref 0.0–0.4)
Eos: 2 %
Hematocrit: 42.7 % (ref 34.0–46.6)
Hemoglobin: 14.1 g/dL (ref 11.1–15.9)
Immature Grans (Abs): 0 10*3/uL (ref 0.0–0.1)
Immature Granulocytes: 0 %
Lymphocytes Absolute: 1.3 10*3/uL (ref 0.7–3.1)
Lymphs: 23 %
MCH: 30.1 pg (ref 26.6–33.0)
MCHC: 33 g/dL (ref 31.5–35.7)
MCV: 91 fL (ref 79–97)
Monocytes Absolute: 0.5 10*3/uL (ref 0.1–0.9)
Monocytes: 9 %
Neutrophils Absolute: 3.9 10*3/uL (ref 1.4–7.0)
Neutrophils: 66 %
Platelets: 248 10*3/uL (ref 150–450)
RBC: 4.69 x10E6/uL (ref 3.77–5.28)
RDW: 13.2 % (ref 11.7–15.4)
WBC: 5.9 10*3/uL (ref 3.4–10.8)

## 2022-09-12 LAB — CMP14+EGFR
ALT: 21 IU/L (ref 0–32)
AST: 26 IU/L (ref 0–40)
Albumin/Globulin Ratio: 2.3 — ABNORMAL HIGH (ref 1.2–2.2)
Albumin: 4.8 g/dL (ref 3.9–4.9)
Alkaline Phosphatase: 60 IU/L (ref 44–121)
BUN/Creatinine Ratio: 23 (ref 12–28)
BUN: 17 mg/dL (ref 8–27)
Bilirubin Total: 1.4 mg/dL — ABNORMAL HIGH (ref 0.0–1.2)
CO2: 25 mmol/L (ref 20–29)
Calcium: 9.3 mg/dL (ref 8.7–10.3)
Chloride: 103 mmol/L (ref 96–106)
Creatinine, Ser: 0.74 mg/dL (ref 0.57–1.00)
Globulin, Total: 2.1 g/dL (ref 1.5–4.5)
Glucose: 89 mg/dL (ref 70–99)
Potassium: 4.3 mmol/L (ref 3.5–5.2)
Sodium: 143 mmol/L (ref 134–144)
Total Protein: 6.9 g/dL (ref 6.0–8.5)
eGFR: 88 mL/min/{1.73_m2} (ref >59)

## 2022-09-12 LAB — LIPID PANEL
Chol/HDL Ratio: 2 ratio (ref 0.0–4.4)
Cholesterol, Total: 143 mg/dL (ref 100–199)
HDL: 71 mg/dL (ref >39)
LDL Chol Calc (NIH): 59 mg/dL (ref 0–99)
Triglycerides: 66 mg/dL (ref 0–149)
VLDL Cholesterol Cal: 13 mg/dL (ref 5–40)

## 2022-09-12 LAB — VITAMIN D 25 HYDROXY (VIT D DEFICIENCY, FRACTURES): Vit D, 25-Hydroxy: 22.3 ng/mL — ABNORMAL LOW (ref 30.0–100.0)

## 2022-11-08 ENCOUNTER — Encounter: Payer: Self-pay | Admitting: Family Medicine

## 2022-11-08 ENCOUNTER — Telehealth (INDEPENDENT_AMBULATORY_CARE_PROVIDER_SITE_OTHER): Payer: Medicare Other | Admitting: Family Medicine

## 2022-11-08 DIAGNOSIS — F4323 Adjustment disorder with mixed anxiety and depressed mood: Secondary | ICD-10-CM

## 2022-11-08 MED ORDER — SERTRALINE HCL 50 MG PO TABS: 50.0000 mg | ORAL_TABLET | Freq: Every day | ORAL | 1 refills | Status: DC

## 2022-11-08 NOTE — Progress Notes (Signed)
Virtual Visit via telephone Note  I connected with Caroline Hodge on 11/08/22 at 1432 by telephone and verified that I am speaking with the correct person using two identifiers. Caroline Hodge is currently located at home and mother are currently with her during visit. The provider, Fransisca Kaufmann Jeanny Rymer, MD is located in their office at time of visit.  Call ended at 1438  I discussed the limitations, risks, security and privacy concerns of performing an evaluation and management service by telephone and the availability of in person appointments. I also discussed with the patient that there may be a patient responsible charge related to this service. The patient expressed understanding and agreed to proceed.   History and Present Illness: Patient has been taking the zoloft since November.  She denies suicidal ideations. She feels like she is getting some help from it.  She denies side efffects.   1. Adjustment reaction with anxiety and depression     Outpatient Encounter Medications as of 11/08/2022  Medication Sig   aspirin EC 81 MG tablet Take 1 tablet (81 mg total) by mouth daily.   Calcium Carb-Cholecalciferol (CALCIUM-VITAMIN D) 500-200 MG-UNIT tablet Take 1 tablet by mouth daily. Reported on 12/06/2015   fluticasone (FLONASE) 50 MCG/ACT nasal spray Place 1 spray into both nostrils 2 (two) times daily as needed for allergies or rhinitis.   rosuvastatin (CRESTOR) 5 MG tablet Take 1 tablet (5 mg total) by mouth daily.   sertraline (ZOLOFT) 50 MG tablet Take 1 tablet (50 mg total) by mouth daily.   [DISCONTINUED] sertraline (ZOLOFT) 50 MG tablet Take 1 tablet (50 mg total) by mouth daily.   No facility-administered encounter medications on file as of 11/08/2022.    Review of Systems  Constitutional:  Negative for chills and fever.  Eyes:  Negative for visual disturbance.  Respiratory:  Negative for chest tightness and shortness of breath.   Cardiovascular:  Negative for chest pain and  leg swelling.  Musculoskeletal:  Negative for back pain and gait problem.  Skin:  Negative for rash.  Neurological:  Negative for light-headedness and headaches.  Psychiatric/Behavioral:  Positive for decreased concentration and dysphoric mood. Negative for agitation, behavioral problems, self-injury, sleep disturbance and suicidal ideas. The patient is nervous/anxious.   All other systems reviewed and are negative.   Observations/Objective: Patient sounds comfortable and in no acute distress.  Assessment and Plan: Problem List Items Addressed This Visit   None Visit Diagnoses     Adjustment reaction with anxiety and depression    -  Primary   Relevant Medications   sertraline (ZOLOFT) 50 MG tablet       Continue zoloft, seems to be doing well Follow up plan: Return in about 6 months (around 05/09/2023), or if symptoms worsen or fail to improve, for depression. .     I discussed the assessment and treatment plan with the patient. The patient was provided an opportunity to ask questions and all were answered. The patient agreed with the plan and demonstrated an understanding of the instructions.   The patient was advised to call back or seek an in-person evaluation if the symptoms worsen or if the condition fails to improve as anticipated.  The above assessment and management plan was discussed with the patient. The patient verbalized understanding of and has agreed to the management plan. Patient is aware to call the clinic if symptoms persist or worsen. Patient is aware when to return to the clinic for a follow-up visit. Patient educated on  when it is appropriate to go to the emergency department.    I provided 6 minutes of non-face-to-face time during this encounter.    Worthy Rancher, MD

## 2023-04-08 ENCOUNTER — Ambulatory Visit (INDEPENDENT_AMBULATORY_CARE_PROVIDER_SITE_OTHER): Payer: Medicare Other

## 2023-04-08 VITALS — Ht 67.0 in | Wt 115.0 lb

## 2023-04-08 DIAGNOSIS — Z0000 Encounter for general adult medical examination without abnormal findings: Secondary | ICD-10-CM

## 2023-04-08 NOTE — Progress Notes (Signed)
Subjective:   Caroline Hodge is a 72 y.o. female who presents for Medicare Annual (Subsequent) preventive examination.  Visit Complete: Virtual  I connected with  Caroline Hodge on 04/08/23 by a audio enabled telemedicine application and verified that I am speaking with the correct person using two identifiers.  Patient Location: Home  Provider Location: Home Office  I discussed the limitations of evaluation and management by telemedicine. The patient expressed understanding and agreed to proceed.  Patient Medicare AWV questionnaire was completed by the patient on 04/08/2023; I have confirmed that all information answered by patient is correct and no changes since this date.  Review of Systems     Cardiac Risk Factors include: advanced age (>20men, >61 women);dyslipidemia     Objective:    Today's Vitals   04/08/23 1444  Weight: 115 lb (52.2 kg)  Height: 5\' 7"  (1.702 m)   Body mass index is 18.01 kg/m.     04/08/2023    2:48 PM 04/05/2022    2:49 PM 04/04/2021    3:09 PM 12/02/2017   10:51 AM 11/26/2016   10:25 AM 12/06/2015    2:25 PM 11/23/2015    8:39 AM  Advanced Directives  Does Patient Have a Medical Advance Directive? Yes Yes No Yes Yes Yes Yes  Type of Estate agent of [place];Living will Healthcare Power of [place];Living will  Healthcare Power of [place];Living will Healthcare Power of [place];Living will Living will;Healthcare Power of Attorney   Copy of Healthcare Power of Attorney in Chart? No - copy requested No - copy requested   No - copy requested  No - copy requested  Would patient like information on creating a medical advance directive?   No - Patient declined        Current Medications (verified) Outpatient Encounter Medications as of 04/08/2023  Medication Sig   aspirin EC 81 MG tablet Take 1 tablet (81 mg total) by mouth daily.   Calcium Carb-Cholecalciferol (CALCIUM-VITAMIN D) 500-200 MG-UNIT tablet Take 1 tablet by mouth  daily. Reported on 12/06/2015   fluticasone (FLONASE) 50 MCG/ACT nasal spray Place 1 spray into both nostrils 2 (two) times daily as needed for allergies or rhinitis.   rosuvastatin (CRESTOR) 5 MG tablet Take 1 tablet (5 mg total) by mouth daily.   sertraline (ZOLOFT) 50 MG tablet Take 1 tablet (50 mg total) by mouth daily.   No facility-administered encounter medications on file as of 04/08/2023.    Allergies (verified) Patient has no known allergies.   History: Past Medical History:  Diagnosis Date   Carotid artery occlusion    Osteopenia    Personal history of kidney stones 2007   Vitamin D deficiency    Past Surgical History:  Procedure Laterality Date   CATARACT EXTRACTION W/PHACO Right 04/25/2014   Procedure: CATARACT EXTRACTION PHACO AND INTRAOCULAR LENS PLACEMENT (IOC);  Surgeon: Gemma Payor, MD;  Location: [facility];  Service: Ophthalmology;  Laterality: Right;  CDE 8.46   CATARACT EXTRACTION W/PHACO Left 05/09/2014   Procedure: CATARACT EXTRACTION PHACO AND INTRAOCULAR LENS PLACEMENT LEFT EYE CDE=6.23;  Surgeon: Gemma Payor, MD;  Location: [facility];  Service: Ophthalmology;  Laterality: Left;   COLONOSCOPY  2006   SKIN GRAFT Left 1975   foot; after foot injury   Family History  Problem Relation Age of Onset   Healthy Mother    Healthy Father    Colon cancer Neg Hx    Social History   Socioeconomic History   Marital status: Single  Spouse name: Not on file   Number of children: 0   Years of education: Not on file   Highest education level: Not on file  Occupational History   Occupation: retired  Tobacco Use   Smoking status: Never    Passive exposure: Never   Smokeless tobacco: Never  Vaping Use   Vaping Use: Never used  Substance and Sexual Activity   Alcohol use: Yes    Alcohol/week: 2.0 standard drinks of alcohol    Types: 1 Glasses of wine, 1 Standard drinks or equivalent per week   Drug use: No   Sexual activity: Yes  Other Topics Concern   Not on file   Social History Narrative   Lives alone.   Social Determinants of Health   Financial Resource Strain: Low Risk  (04/08/2023)   Overall Financial Resource Strain (CARDIA)    Difficulty of Paying Living Expenses: Not hard at all  Food Insecurity: No Food Insecurity (04/08/2023)   Hunger Vital Sign    Worried About Running Out of Food in the Last Year: Never true    Ran Out of Food in the Last Year: Never true  Transportation Needs: No Transportation Needs (04/08/2023)   PRAPARE - Administrator, Civil Service (Medical): No    Lack of Transportation (Non-Medical): No  Physical Activity: Insufficiently Active (04/08/2023)   Exercise Vital Sign    Days of Exercise per Week: 3 days    Minutes of Exercise per Session: 30 min  Stress: No Stress Concern Present (04/08/2023)   [name] of Occupational Health - Occupational Stress Questionnaire    Feeling of Stress : Not at all  Social Connections: Moderately Isolated (04/08/2023)   Social Connection and Isolation Panel [NHANES]    Frequency of Communication with Friends and Family: More than three times a week    Frequency of Social Gatherings with Friends and Family: More than three times a week    Attends Religious Services: More than 4 times per year    Active Member of [employer] or Organizations: No    Attends Engineer, structural: Never    Marital Status: Never married    Tobacco Counseling Counseling given: Not Answered   Clinical Intake:  Pre-visit preparation completed: Yes  Pain : No/denies pain     Nutritional Risks: None Diabetes: No  How often do you need to have someone help you when you read instructions, pamphlets, or other written materials from your doctor or pharmacy?: 1 - Never  Interpreter Needed?: No  Information entered by :: Renie Ora, LPN   Activities of Daily Living    04/08/2023    2:48 PM  In your present state of health, do you have any difficulty performing the  following activities:  Hearing? 0  Vision? 0  Difficulty concentrating or making decisions? 0  Walking or climbing stairs? 0  Dressing or bathing? 0  Doing errands, shopping? 0  Preparing Food and eating ? N  Using the Toilet? N  In the past six months, have you accidently leaked urine? N  Do you have problems with loss of bowel control? N  Managing your Medications? N  Managing your Finances? N  Housekeeping or managing your Housekeeping? N    Patient Care Team: Dettinger, Elige Radon, MD as PCP - General (Family Medicine) Delora Fuel, OD (Optometry) Early, Kristen Loader, MD as Consulting Physician (Vascular Surgery)  Indicate any recent Medical Services you may have received from other than [facility] providers in  the past year (date may be approximate).     Assessment:   This is a routine wellness examination for Caroline Hodge.  Hearing/Vision screen Vision Screening - Comments:: Wears rx glasses - up to date with routine eye exams with  Dr.Johnson   Dietary issues and exercise activities discussed:     Goals Addressed             This Visit's Progress    Patient Stated   On track    Just wants to stay healthy and independent       Depression Screen    04/08/2023    2:47 PM 04/05/2022    2:48 PM 12/17/2021   11:36 AM 09/10/2021   10:02 AM 04/04/2021    3:03 PM 09/06/2020   10:36 AM 09/08/2019   10:02 AM  PHQ 2/9 Scores  PHQ - 2 Score 0 0 0 0 0 0 0  PHQ- 9 Score   0        Fall Risk    04/08/2023    2:45 PM 04/05/2022    2:47 PM 12/17/2021   11:36 AM 09/10/2021   10:02 AM 04/04/2021    3:09 PM  Fall Risk   Falls in the past year? 0 0 0 0 0  Number falls in past yr: 0 0   0  Injury with Fall? 0 0   0  Risk for fall due to : No Fall Risks No Fall Risks   No Fall Risks  Follow up Falls prevention discussed Falls prevention discussed   Falls prevention discussed    MEDICARE RISK AT HOME:  Medicare Risk at Home - 04/08/23 1445     Any stairs in or around the home? Yes     If so, are there any without handrails? No    Home free of loose throw rugs in walkways, pet beds, electrical cords, etc? Yes    Adequate lighting in your home to reduce risk of falls? Yes    Life alert? No    Use of a cane, walker or w/c? No    Grab bars in the bathroom? Yes    Shower chair or bench in shower? No    Elevated toilet seat or a handicapped toilet? No             TIMED UP AND GO:  Was the test performed?  No    Cognitive Function:        04/08/2023    2:48 PM 04/05/2022    2:49 PM 04/04/2021    3:04 PM  6CIT Screen  What Year? 0 points 0 points 0 points  What month? 0 points 0 points 0 points  What time? 0 points 0 points 0 points  Count back from 20 0 points 0 points 0 points  Months in reverse 0 points 0 points 0 points  Repeat phrase 0 points 0 points 0 points  Total Score 0 points 0 points 0 points    Immunizations Immunization History  Administered Date(s) Administered   COVID-19, mRNA, vaccine(Comirnaty)12 years and older 07/23/2022   Fluad Quad(high Dose 65+) 07/26/2019, 07/11/2022   Influenza Whole 07/14/2012   Influenza, High Dose Seasonal PF 08/12/2018   Influenza,inj,Quad PF,6+ Mos 07/21/2013, 07/29/2014, 08/28/2015, 08/06/2016, 07/28/2017   Influenza-Unspecified 08/02/2020, 07/10/2021   Moderna Covid-19 Vaccine Bivalent Booster 42yrs & up 07/31/2021, 03/05/2022   Moderna Sars-Covid-2 Vaccination 03/14/2020, 04/12/2020, 08/09/2020, 02/06/2021   Pneumococcal Conjugate-13 08/10/2013   Pneumococcal Polysaccharide-23 09/02/2018   Respiratory Syncytial Virus  Vaccine,Recomb Aduvanted(Arexvy) 07/11/2022   Tdap 06/15/2011   Zoster Recombinat (Shingrix) 01/01/2022, 04/02/2022   Zoster, Live 11/24/2013    TDAP status: Due, Education has been provided regarding the importance of this vaccine. Advised may receive this vaccine at local pharmacy or Health Dept. Aware to provide a copy of the vaccination record if obtained from local pharmacy or Health  Dept. Verbalized acceptance and understanding.  Flu Vaccine status: Up to date  Pneumococcal vaccine status: Up to date  Covid-19 vaccine status: Completed vaccines  Qualifies for Shingles Vaccine? Yes   Zostavax completed Yes   Shingrix Completed?: Yes  Screening Tests Health Maintenance  Topic Date Due   DTaP/Tdap/Td (2 - Td or Tdap) 06/14/2021   INFLUENZA VACCINE  05/15/2023   DEXA SCAN  09/11/2023   Medicare Annual Wellness (AWV)  04/07/2024   MAMMOGRAM  05/06/2024   Colonoscopy  12/24/2025   Pneumonia Vaccine 74+ Years old  Completed   COVID-19 Vaccine  Completed   Hepatitis C Screening  Completed   Zoster Vaccines- Shingrix  Completed   HPV VACCINES  Aged Out   COLON CANCER SCREENING ANNUAL FOBT  Discontinued    Health Maintenance  Health Maintenance Due  Topic Date Due   DTaP/Tdap/Td (2 - Td or Tdap) 06/14/2021    Colorectal cancer screening: Type of screening: Colonoscopy. Completed 12/25/2015. Repeat every 10 years  Mammogram status: Completed 05/06/2022. Repeat every year  Bone Density status: Completed 09/10/2021. Results reflect: Bone density results: OSTEOPOROSIS. Repeat every 2 years.  Lung Cancer Screening: (Low Dose CT Chest recommended if Age 49-80 years, 20 pack-year currently smoking OR have quit w/in 15years.) does not qualify.   Lung Cancer Screening Referral: n/a  Additional Screening:  Hepatitis C Screening: does not qualify; Completed 09/08/2019  Vision Screening: Recommended annual ophthalmology exams for early detection of glaucoma and other disorders of the eye. Is the patient up to date with their annual eye exam?  Yes  Who is the provider or what is the name of the office in which the patient attends annual eye exams? Dr.Johnson  If pt is not established with a provider, would they like to be referred to a provider to establish care? No .   Dental Screening: Re commended annual dental exams for proper oral hygiene   Community  Resource Referral / Chronic Care Management: CRR required this visit?  No   CCM required this visit?  No     Plan:     I have personally reviewed and noted the following in the patient's chart:   Medical and social history Use of alcohol, tobacco or illicit drugs  Current medications and supplements including opioid prescriptions. Patient is not currently taking opioid prescriptions. Functional ability and status Nutritional status Physical activity Advanced directives List of other physicians Hospitalizations, surgeries, and ER visits in previous 12 months Vitals Screenings to include cognitive, depression, and falls Referrals and appointments  In addition, I have reviewed and discussed with patient certain preventive protocols, quality metrics, and best practice recommendations. A written personalized care plan for preventive services as well as general preventive health recommendations were provided to patient.     Lorrene Reid, LPN   [id number]   After Visit Summary: (MyChart) Due to this being a telephonic visit, the after visit summary with patients personalized plan was offered to patient via MyChart   Nurse Notes: Due Tdap Vaccine

## 2023-04-08 NOTE — Patient Instructions (Signed)
Caroline Hodge , Thank you for taking time to come for your Medicare Wellness Visit. I appreciate your ongoing commitment to your health goals. Please review the following plan we discussed and let me know if I can assist you in the future.   These are the goals we discussed:  Goals      Patient Stated     Just wants to stay healthy and independent        This is a list of the screening recommended for you and due dates:  Health Maintenance  Topic Date Due   DTaP/Tdap/Td vaccine (2 - Td or Tdap) 06/14/2021   Flu Shot  05/15/2023   DEXA scan (bone density measurement)  09/11/2023   Medicare Annual Wellness Visit  04/07/2024   Mammogram  05/06/2024   Colon Cancer Screening  12/24/2025   Pneumonia Vaccine  Completed   COVID-19 Vaccine  Completed   Hepatitis C Screening  Completed   Zoster (Shingles) Vaccine  Completed   HPV Vaccine  Aged Out   Stool Blood Test  Discontinued    Advanced directives: Please bring a copy of your health care power of attorney and living will to the office to be added to your chart at your convenience.   Conditions/risks identified: Aim for 30 minutes of exercise or brisk walking, 6-8 glasses of water, and 5 servings of fruits and vegetables each day.   Next appointment: Follow up in one year for your annual wellness visit    Preventive Care 65 Years and Older, Female Preventive care refers to lifestyle choices and visits with your health care provider that can promote health and wellness. What does preventive care include? A yearly physical exam. This is also called an annual well check. Dental exams once or twice a year. Routine eye exams. Ask your health care provider how often you should have your eyes checked. Personal lifestyle choices, including: Daily care of your teeth and gums. Regular physical activity. Eating a healthy diet. Avoiding tobacco and drug use. Limiting alcohol use. Practicing safe sex. Taking low-dose aspirin every  day. Taking vitamin and mineral supplements as recommended by your health care provider. What happens during an annual well check? The services and screenings done by your health care provider during your annual well check will depend on your age, overall health, lifestyle risk factors, and family history of disease. Counseling  Your health care provider may ask you questions about your: Alcohol use. Tobacco use. Drug use. Emotional well-being. Home and relationship well-being. Sexual activity. Eating habits. History of falls. Memory and ability to understand (cognition). Work and work Astronomer. Reproductive health. Screening  You may have the following tests or measurements: Height, weight, and BMI. Blood pressure. Lipid and cholesterol levels. These may be checked every 5 years, or more frequently if you are over 69 years old. Skin check. Lung cancer screening. You may have this screening every year starting at age 28 if you have a 30-pack-year history of smoking and currently smoke or have quit within the past 15 years. Fecal occult blood test (FOBT) of the stool. You may have this test every year starting at age 55. Flexible sigmoidoscopy or colonoscopy. You may have a sigmoidoscopy every 5 years or a colonoscopy every 10 years starting at age 67. Hepatitis C blood test. Hepatitis B blood test. Sexually transmitted disease (STD) testing. Diabetes screening. This is done by checking your blood sugar (glucose) after you have not eaten for a while (fasting). You may have this  done every 1-3 years. Bone density scan. This is done to screen for osteoporosis. You may have this done starting at age 28. Mammogram. This may be done every 1-2 years. Talk to your health care provider about how often you should have regular mammograms. Talk with your health care provider about your test results, treatment options, and if necessary, the need for more tests. Vaccines  Your health care  provider may recommend certain vaccines, such as: Influenza vaccine. This is recommended every year. Tetanus, diphtheria, and acellular pertussis (Tdap, Td) vaccine. You may need a Td booster every 10 years. Zoster vaccine. You may need this after age 43. Pneumococcal 13-valent conjugate (PCV13) vaccine. One dose is recommended after age 75. Pneumococcal polysaccharide (PPSV23) vaccine. One dose is recommended after age 66. Talk to your health care provider about which screenings and vaccines you need and how often you need them. This information is not intended to replace advice given to you by your health care provider. Make sure you discuss any questions you have with your health care provider. Document Released: 10/27/2015 Document Revised: 06/19/2016 Document Reviewed: 08/01/2015 Elsevier Interactive Patient Education  2017 [place] Prevention in the Home Falls can cause injuries. They can happen to people of all ages. There are many things you can do to make your home safe and to help prevent falls. What can I do on the outside of my home? Regularly fix the edges of walkways and driveways and fix any cracks. Remove anything that might make you trip as you walk through a door, such as a raised step or threshold. Trim any bushes or trees on the path to your home. Use bright outdoor lighting. Clear any walking paths of anything that might make someone trip, such as rocks or tools. Regularly check to see if handrails are loose or broken. Make sure that both sides of any steps have handrails. Any raised decks and porches should have guardrails on the edges. Have any leaves, snow, or ice cleared regularly. Use sand or salt on walking paths during winter. Clean up any spills in your garage right away. This includes oil or grease spills. What can I do in the bathroom? Use night lights. Install grab bars by the toilet and in the tub and shower. Do not use towel bars as grab  bars. Use non-skid mats or decals in the tub or shower. If you need to sit down in the shower, use a plastic, non-slip stool. Keep the floor dry. Clean up any water that spills on the floor as soon as it happens. Remove soap buildup in the tub or shower regularly. Attach bath mats securely with double-sided non-slip rug tape. Do not have throw rugs and other things on the floor that can make you trip. What can I do in the bedroom? Use night lights. Make sure that you have a light by your bed that is easy to reach. Do not use any sheets or blankets that are too big for your bed. They should not hang down onto the floor. Have a firm chair that has side arms. You can use this for support while you get dressed. Do not have throw rugs and other things on the floor that can make you trip. What can I do in the kitchen? Clean up any spills right away. Avoid walking on wet floors. Keep items that you use a lot in easy-to-reach places. If you need to reach something above you, use a strong step stool that  has a grab bar. Keep electrical cords out of the way. Do not use floor polish or wax that makes floors slippery. If you must use wax, use non-skid floor wax. Do not have throw rugs and other things on the floor that can make you trip. What can I do with my stairs? Do not leave any items on the stairs. Make sure that there are handrails on both sides of the stairs and use them. Fix handrails that are broken or loose. Make sure that handrails are as long as the stairways. Check any carpeting to make sure that it is firmly attached to the stairs. Fix any carpet that is loose or worn. Avoid having throw rugs at the top or bottom of the stairs. If you do have throw rugs, attach them to the floor with carpet tape. Make sure that you have a light switch at the top of the stairs and the bottom of the stairs. If you do not have them, ask someone to add them for you. What else can I do to help prevent  falls? Wear shoes that: Do not have high heels. Have rubber bottoms. Are comfortable and fit you well. Are closed at the toe. Do not wear sandals. If you use a stepladder: Make sure that it is fully opened. Do not climb a closed stepladder. Make sure that both sides of the stepladder are locked into place. Ask someone to hold it for you, if possible. Clearly mark and make sure that you can see: Any grab bars or handrails. First and last steps. Where the edge of each step is. Use tools that help you move around (mobility aids) if they are needed. These include: Canes. Walkers. Scooters. Crutches. Turn on the lights when you go into a dark area. Replace any light bulbs as soon as they burn out. Set up your furniture so you have a clear path. Avoid moving your furniture around. If any of your floors are uneven, fix them. If there are any pets around you, be aware of where they are. Review your medicines with your doctor. Some medicines can make you feel dizzy. This can increase your chance of falling. Ask your doctor what other things that you can do to help prevent falls. This information is not intended to replace advice given to you by your health care provider. Make sure you discuss any questions you have with your health care provider. Document Released: 07/27/2009 Document Revised: 03/07/2016 Document Reviewed: 11/04/2014 Elsevier Interactive Patient Education  2017 [employer].

## 2023-05-13 ENCOUNTER — Other Ambulatory Visit: Payer: Self-pay | Admitting: Family Medicine

## 2023-05-13 DIAGNOSIS — Z1231 Encounter for screening mammogram for malignant neoplasm of breast: Secondary | ICD-10-CM

## 2023-05-19 ENCOUNTER — Ambulatory Visit
Admission: RE | Admit: 2023-05-19 | Discharge: 2023-05-19 | Disposition: A | Discharge status: Home / Self Care | Payer: Medicare Other | Source: Ambulatory Visit | Attending: Family Medicine | Admitting: Family Medicine

## 2023-05-19 DIAGNOSIS — Z1231 Encounter for screening mammogram for malignant neoplasm of breast: Secondary | ICD-10-CM | POA: Diagnosis not present

## 2023-05-22 ENCOUNTER — Other Ambulatory Visit: Payer: Self-pay | Admitting: Family Medicine

## 2023-05-22 DIAGNOSIS — F4323 Adjustment disorder with mixed anxiety and depressed mood: Secondary | ICD-10-CM

## 2023-06-23 IMAGING — MG MM DIGITAL SCREENING BILAT W/ TOMO AND CAD
8 series · 9 of 24 positions shown · non-contrast
Comparison: Previous exam(s).

CLINICAL DATA: Screening.

EXAM:
DIGITAL SCREENING BILATERAL MAMMOGRAM WITH TOMOSYNTHESIS AND CAD
TECHNIQUE: Bilateral screening digital craniocaudal and mediolateral oblique
mammograms were obtained. Bilateral screening digital breast
tomosynthesis was performed. The images were evaluated with
computer-aided detection.

[L MLO synth-2D]
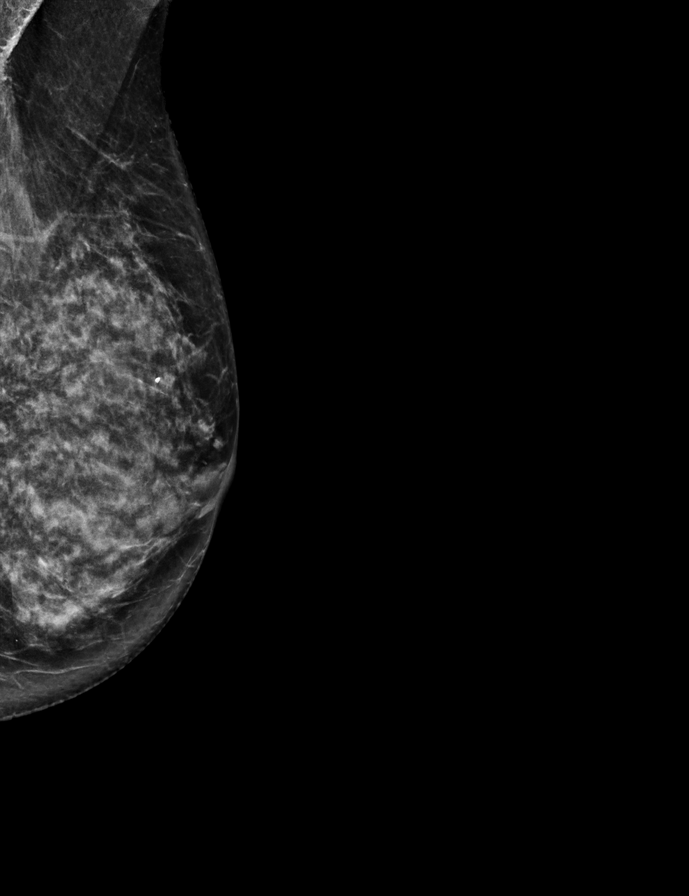

[L CC synth-2D]
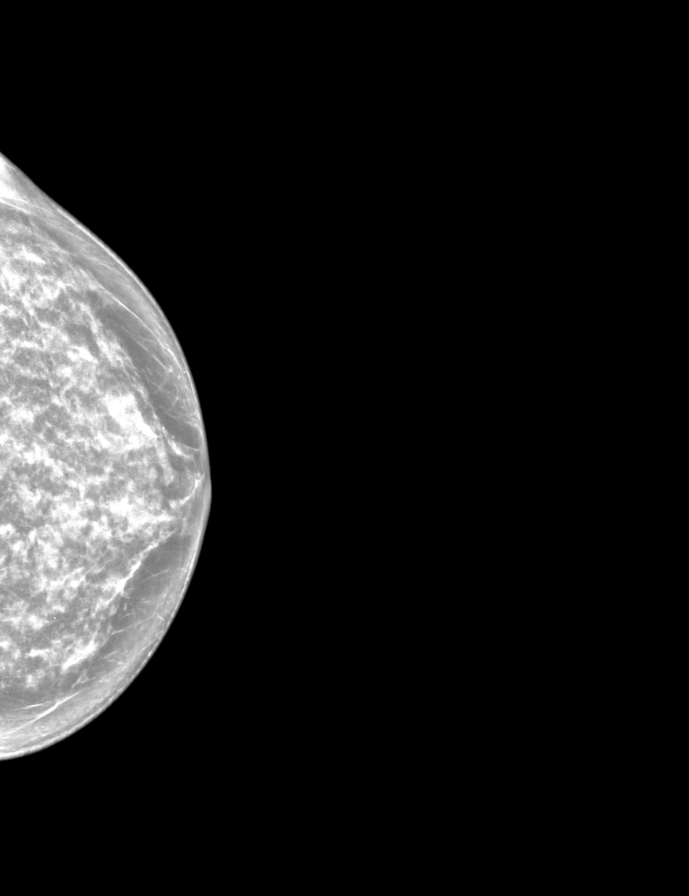

[R MLO synth-2D]
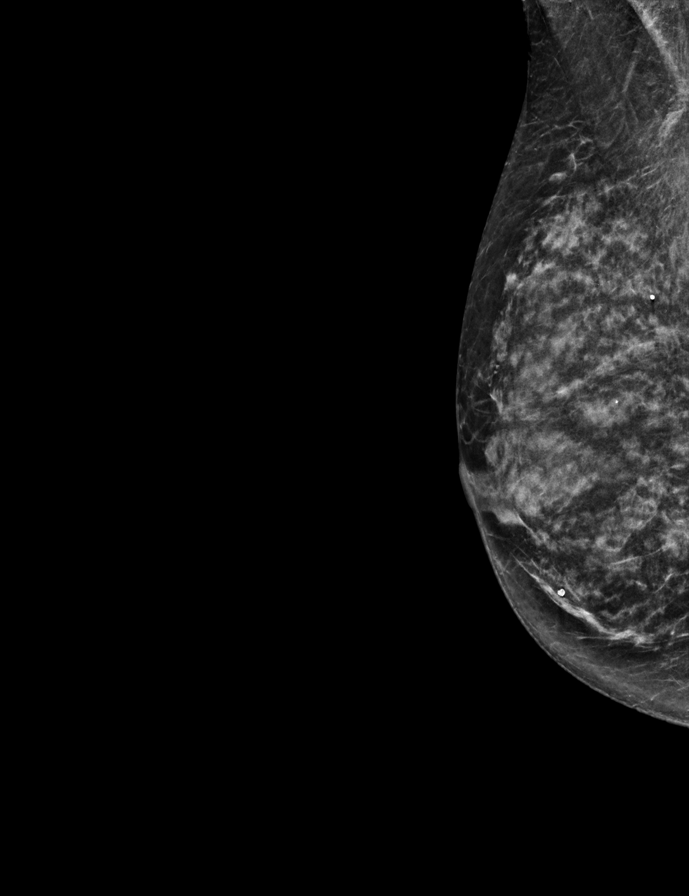

[R CC synth-2D]
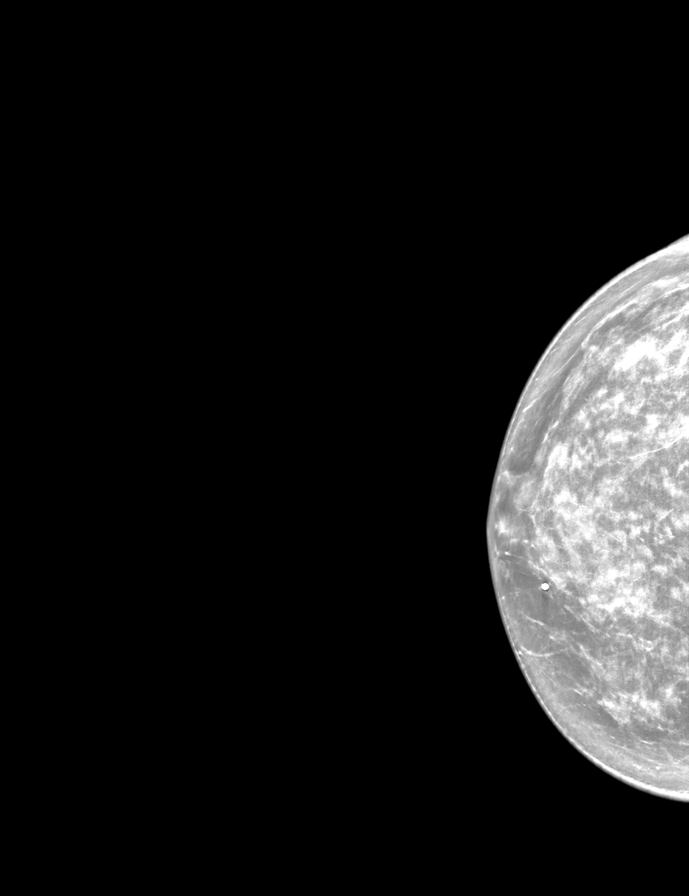

[R MLO tomo · 2 of 55 frames shown]
[frame 18/55]
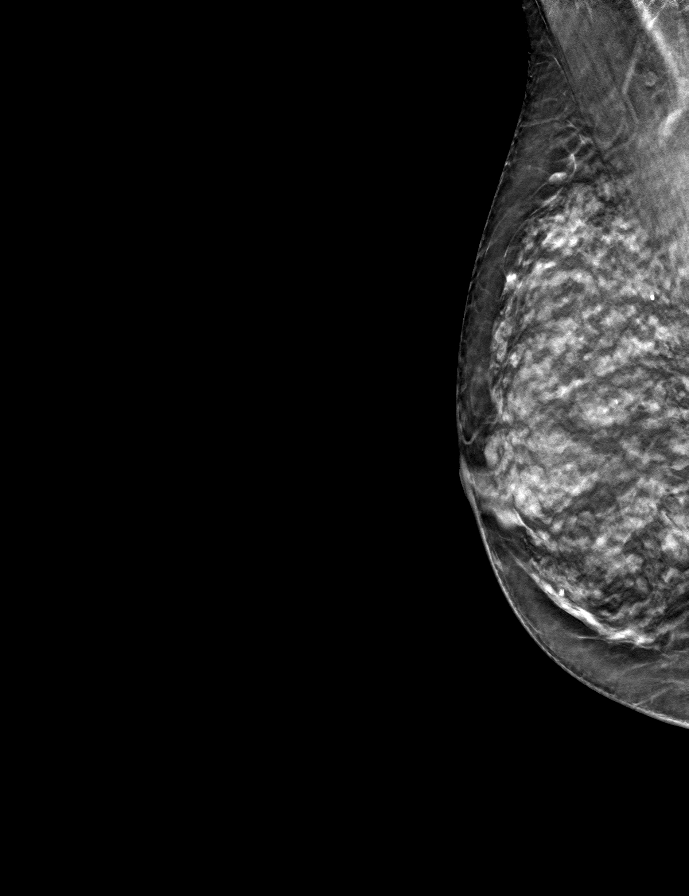
[frame 28/55]
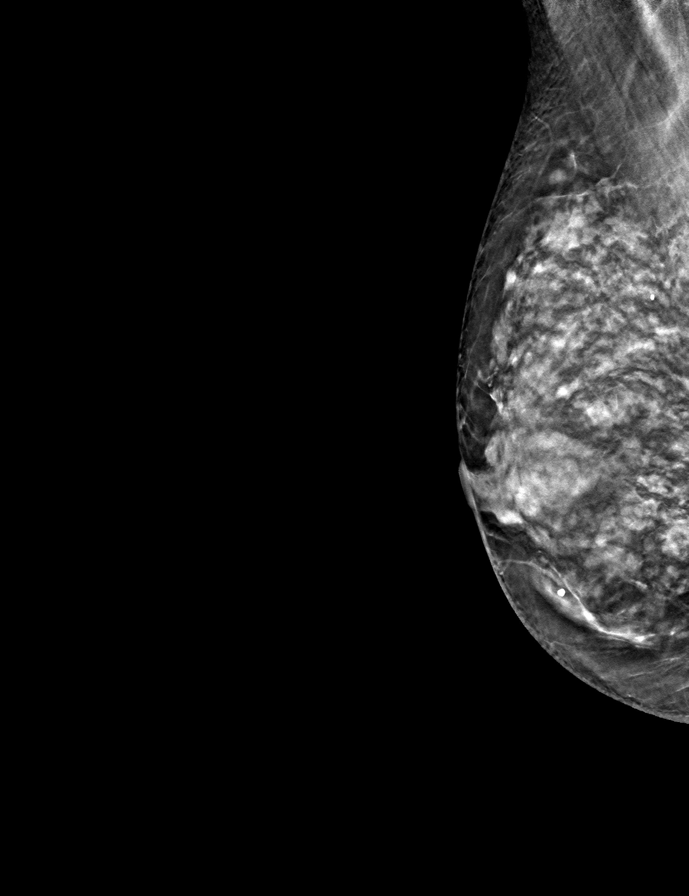

[L MLO tomo · tomo slice 31/60.0]
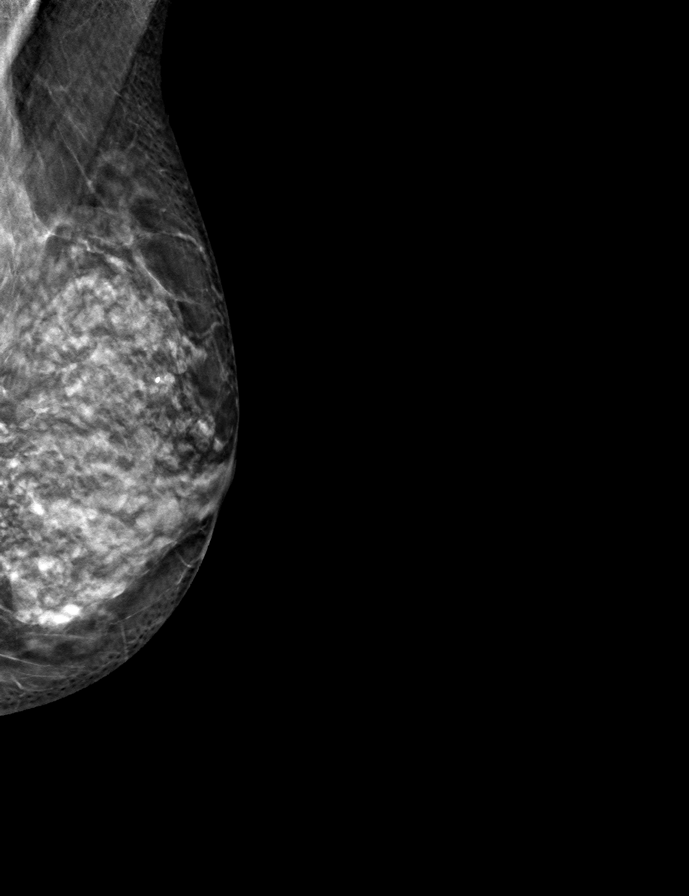

[L CC tomo · tomo slice 31/61.0]
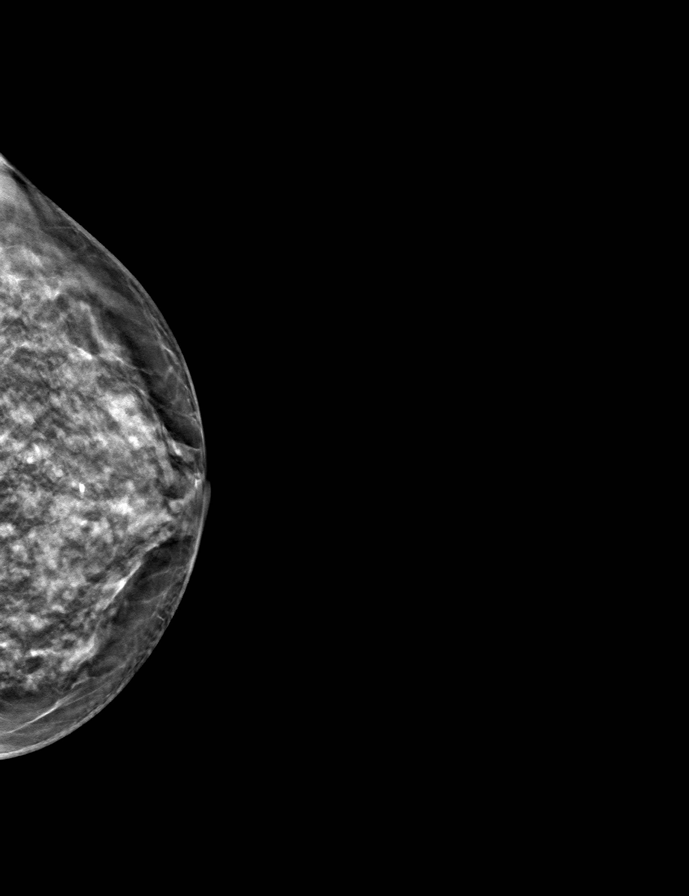

[R CC tomo · tomo slice 31/62.0]
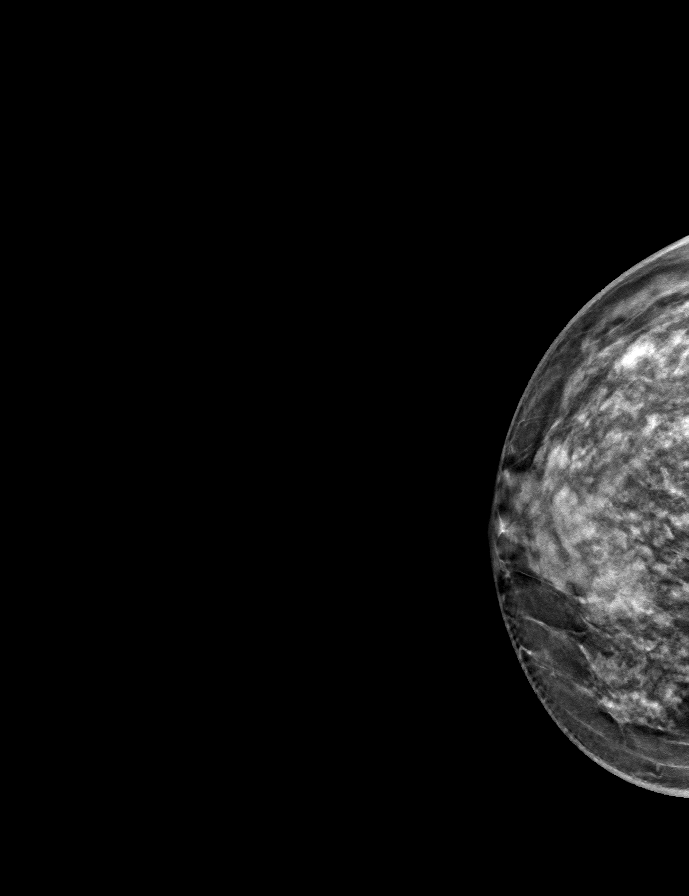

[9 of 24 positions shown; findings below may reference images not displayed]

ACR Breast Density Category c: The breast tissue is heterogeneously
dense, which may obscure small masses.
FINDINGS: There are no findings suspicious for malignancy.
IMPRESSION: No mammographic evidence of malignancy. A result letter of this
screening mammogram will be mailed directly to the patient.

RECOMMENDATION:
Screening mammogram in one year. (Code:Q3-W-BC3)

BI-RADS CATEGORY  1: Negative.

## 2023-07-09 ENCOUNTER — Other Ambulatory Visit: Payer: Self-pay | Admitting: *Deleted

## 2023-07-09 DIAGNOSIS — I6523 Occlusion and stenosis of bilateral carotid arteries: Secondary | ICD-10-CM

## 2023-07-09 NOTE — Addendum Note (Signed)
Addended by: Thomasena Edis on: 07/09/2023 07:39 AM   Modules accepted: Orders

## 2023-07-14 NOTE — Progress Notes (Signed)
[facility] [place]  Patient name: Caroline Hodge MRN: [id number] DOB: Feb 17, 1951 Sex: female  REASON FOR VISIT: Continued follow-up bilateral carotid stenosis  HPI: Caroline Hodge is a 72 y.o. female here today for follow-up.  Had discovery of carotid stenosis and evaluation of right carotid bruit.  She has no prior history of transient ischemic, aphasia, amaurosis fugax or stroke.  She remains otherwise healthy.  Has never smoked cigarettes.  Past Medical History:  Diagnosis Date   Carotid artery occlusion    Osteopenia    Personal history of kidney stones 2007   Vitamin D deficiency     Family History  Problem Relation Age of Onset   Healthy Mother    Healthy Father    Colon cancer Neg Hx     SOCIAL HISTORY: Social History   Tobacco Use   Smoking status: Never    Passive exposure: Never   Smokeless tobacco: Never  Substance Use Topics   Alcohol use: Yes    Alcohol/week: 2.0 standard drinks of alcohol    Types: 1 Glasses of wine, 1 Standard drinks or equivalent per week    No Known Allergies  Current Outpatient Medications  Medication Sig Dispense Refill   aspirin EC 81 MG tablet Take 1 tablet (81 mg total) by mouth daily. 100 tablet 11   Calcium Carb-Cholecalciferol (CALCIUM-VITAMIN D) 500-200 MG-UNIT tablet Take 1 tablet by mouth daily. Reported on 12/06/2015 90 tablet 3   fluticasone (FLONASE) 50 MCG/ACT nasal spray Place 1 spray into both nostrils 2 (two) times daily as needed for allergies or rhinitis. 16 g 6   rosuvastatin (CRESTOR) 5 MG tablet Take 1 tablet (5 mg total) by mouth daily. 90 tablet 3   sertraline (ZOLOFT) 50 MG tablet Take 1 tablet (50 mg total) by mouth daily. **NEEDS TO BE SEEN BEFORE NEXT REFILL** 30 tablet 0   No current facility-administered medications for this visit.    REVIEW OF SYSTEMS:  [X]  denotes positive finding, [ ]  denotes negative finding Cardiac  Comments:  Chest  pain or chest pressure:    Shortness of breath upon exertion:    Short of breath when lying flat:    Irregular heart rhythm:        Vascular    Pain in calf, thigh, or hip brought on by ambulation:    Pain in feet at night that wakes you up from your sleep:     Blood clot in your veins:    Leg swelling:           PHYSICAL EXAM: There were no vitals filed for this visit.   GENERAL: The patient is a well-nourished female, in no acute distress. The vital signs are documented above. CARDIOVASCULAR: She does have a soft right carotid bruit and no bruit on the left.  2+ radial pulses bilaterally PULMONARY: There is good air exchange  MUSCULOSKELETAL: There are no major deformities or cyanosis. NEUROLOGIC: No focal weakness or paresthesias are detected. SKIN: There are no ulcers or rashes noted. PSYCHIATRIC: The patient has a normal affect.  DATA:  Carotid duplex today was reviewed with the patient.  She has 40- 59% internal carotid artery stenosis at the bifurcation bilaterally which is unchanged from her study 1 year ago  MEDICAL ISSUES: Moderate bilateral carotid disease.  She is appropriately treated with cholesterol-lowering agent and aspirin.  I again reviewed symptoms of symptomatic carotid disease and she knows to report immediately should this occur.  Otherwise we will  see her again in 1 year with repeat carotid duplex    Larina Earthly, MD FACS [facility] [phone]  Note: Portions of this report may have been transcribed using voice recognition software.  Every effort has been made to ensure accuracy; however, inadvertent computerized transcription errors may still be present.

## 2023-07-15 ENCOUNTER — Encounter: Payer: Self-pay | Admitting: Vascular Surgery

## 2023-07-15 ENCOUNTER — Ambulatory Visit (INDEPENDENT_AMBULATORY_CARE_PROVIDER_SITE_OTHER): Payer: Medicare Other | Admitting: Vascular Surgery

## 2023-07-15 ENCOUNTER — Ambulatory Visit (HOSPITAL_COMMUNITY)
Admission: RE | Admit: 2023-07-15 | Discharge: 2023-07-15 | Disposition: A | Discharge status: Home / Self Care | Payer: Medicare Other | Source: Ambulatory Visit | Attending: Vascular Surgery | Admitting: Vascular Surgery

## 2023-07-15 VITALS — BP 137/66 | HR 78 | Temp 97.6°F | Resp 20 | Ht 67.0 in | Wt 116.0 lb

## 2023-07-15 DIAGNOSIS — I6523 Occlusion and stenosis of bilateral carotid arteries: Secondary | ICD-10-CM

## 2023-07-29 ENCOUNTER — Other Ambulatory Visit: Payer: Self-pay

## 2023-07-29 DIAGNOSIS — I6523 Occlusion and stenosis of bilateral carotid arteries: Secondary | ICD-10-CM

## 2023-08-03 DIAGNOSIS — Z23 Encounter for immunization: Secondary | ICD-10-CM | POA: Diagnosis not present

## 2023-08-31 ENCOUNTER — Other Ambulatory Visit: Payer: Self-pay | Admitting: Family Medicine

## 2023-08-31 DIAGNOSIS — E785 Hyperlipidemia, unspecified: Secondary | ICD-10-CM

## 2023-08-31 DIAGNOSIS — I779 Disorder of arteries and arterioles, unspecified: Secondary | ICD-10-CM

## 2023-10-16 ENCOUNTER — Ambulatory Visit (INDEPENDENT_AMBULATORY_CARE_PROVIDER_SITE_OTHER): Payer: Medicare Other | Admitting: Family Medicine

## 2023-10-16 ENCOUNTER — Encounter: Payer: Self-pay | Admitting: Family Medicine

## 2023-10-16 VITALS — BP 131/64 | HR 90 | Ht 67.0 in | Wt 122.0 lb

## 2023-10-16 DIAGNOSIS — Z23 Encounter for immunization: Secondary | ICD-10-CM

## 2023-10-16 DIAGNOSIS — E559 Vitamin D deficiency, unspecified: Secondary | ICD-10-CM | POA: Diagnosis not present

## 2023-10-16 DIAGNOSIS — I779 Disorder of arteries and arterioles, unspecified: Secondary | ICD-10-CM | POA: Diagnosis not present

## 2023-10-16 DIAGNOSIS — E785 Hyperlipidemia, unspecified: Secondary | ICD-10-CM

## 2023-10-16 DIAGNOSIS — M8580 Other specified disorders of bone density and structure, unspecified site: Secondary | ICD-10-CM | POA: Diagnosis not present

## 2023-10-16 MED ORDER — ROSUVASTATIN CALCIUM 5 MG PO TABS: 5.0000 mg | ORAL_TABLET | Freq: Every day | ORAL | 4 refills | Status: DC

## 2023-10-16 NOTE — Progress Notes (Signed)
 BP 131/64   Pulse 90   Ht 5' 7 (1.702 m)   Wt 122 lb (55.3 kg)   SpO2 100%   BMI 19.11 kg/m    Subjective:   Patient ID: Caroline Hodge, female    DOB: November 08, 1950, 73 y.o.   MRN: [id number]  HPI: Caroline Hodge is a 73 y.o. female presenting on 10/16/2023 for Medical Management of Chronic Issues and Hyperlipidemia   HPI Physical exam Patient denies any chest pain, shortness of breath, headaches or vision issues, abdominal complaints, diarrhea, nausea, vomiting, or joint issues.   Hyperlipidemia Patient is coming in for recheck of his hyperlipidemia. The patient is currently taking rosuvastatin . They deny any issues with myalgias or history of liver damage from it. They deny any focal numbness or weakness or chest pain.   Osteoporosis/osteopenia and vitamin D  deficiency Fractures or history of fracture: None Medication: Vitamin D  and calcium  Duration of treatment: 5 years Last bone density scan: 09/10/2021 Last T score: -1.8  Relevant past medical, surgical, family and social history reviewed and updated as indicated. Interim medical history since our last visit reviewed. Allergies and medications reviewed and updated.  Review of Systems  Constitutional:  Negative for chills and fever.  HENT:  Negative for congestion, ear discharge and ear pain.   Eyes:  Negative for redness and visual disturbance.  Respiratory:  Negative for chest tightness and shortness of breath.   Cardiovascular:  Negative for chest pain and leg swelling.  Genitourinary:  Negative for difficulty urinating and dysuria.  Musculoskeletal:  Negative for back pain and gait problem.  Skin:  Negative for rash.  Neurological:  Negative for dizziness, light-headedness and headaches.  Psychiatric/Behavioral:  Negative for agitation and behavioral problems.   All other systems reviewed and are negative.   Per HPI unless specifically indicated above   Allergies as of 10/16/2023   No Known Allergies       Medication List        Accurate as of October 16, 2023  2:39 PM. If you have any questions, ask your nurse or doctor.          STOP taking these medications    calcium -vitamin D  500-200 MG-UNIT tablet Stopped by: Fonda LABOR Duard Spiewak       TAKE these medications    aspirin  EC 81 MG tablet Take 1 tablet (81 mg total) by mouth daily.   Coral Calcium  1000 (390 Ca) MG Tabs Take 1 tablet by mouth daily.   rosuvastatin  5 MG tablet Commonly known as: CRESTOR  Take 1 tablet (5 mg total) by mouth daily.   VITAMIN D  PO Take 1,000 mg by mouth daily.         Objective:   BP 131/64   Pulse 90   Ht 5' 7 (1.702 m)   Wt 122 lb (55.3 kg)   SpO2 100%   BMI 19.11 kg/m   Wt Readings from Last 3 Encounters:  10/16/23 122 lb (55.3 kg)  07/15/23 116 lb (52.6 kg)  04/08/23 115 lb (52.2 kg)    Physical Exam Vitals and nursing note reviewed.  Constitutional:      General: She is not in acute distress.    Appearance: Normal appearance. She is well-developed and normal weight. She is not diaphoretic.  HENT:     Right Ear: Tympanic membrane and ear canal normal.     Left Ear: Tympanic membrane and ear canal normal.     Mouth/Throat:     Mouth: Mucous membranes are  moist.     Pharynx: Oropharynx is clear. No oropharyngeal exudate or posterior oropharyngeal erythema.  Eyes:     Conjunctiva/sclera: Conjunctivae normal.  Cardiovascular:     Rate and Rhythm: Normal rate and regular rhythm.     Heart sounds: Normal heart sounds. No murmur heard. Pulmonary:     Effort: Pulmonary effort is normal. No respiratory distress.     Breath sounds: Normal breath sounds. No wheezing.  Abdominal:     General: Abdomen is flat. Bowel sounds are normal. There is no distension.     Tenderness: There is no abdominal tenderness. There is no guarding or rebound.  Musculoskeletal:        General: No swelling or tenderness. Normal range of motion.  Skin:    General: Skin is warm and dry.      Findings: No rash.  Neurological:     Mental Status: She is alert and oriented to person, place, and time.     Coordination: Coordination normal.  Psychiatric:        Behavior: Behavior normal.       Assessment & Plan:   Problem List Items Addressed This Visit       Cardiovascular and Mediastinum   Carotid artery disease (HCC)   Relevant Medications   rosuvastatin  (CRESTOR ) 5 MG tablet   Other Relevant Orders   CBC with Differential/Platelet   CMP14+EGFR   Lipid panel     Musculoskeletal and Integument   Osteopenia   Relevant Orders   VITAMIN D  25 Hydroxy (Vit-D Deficiency, Fractures)     Other   Dyslipidemia - Primary   Relevant Medications   rosuvastatin  (CRESTOR ) 5 MG tablet   Other Relevant Orders   CBC with Differential/Platelet   CMP14+EGFR   Lipid panel   Vitamin D  deficiency   Relevant Orders   VITAMIN D  25 Hydroxy (Vit-D Deficiency, Fractures)    Continue current medicine, seems to be doing well.  Will check blood work today. Follow up plan: Return in about 1 year (around 10/15/2024), or if symptoms worsen or fail to improve, for physical and hld.  Counseling provided for all of the vaccine components Orders Placed This Encounter  Procedures   CBC with Differential/Platelet   CMP14+EGFR   Lipid panel   VITAMIN D  25 Hydroxy (Vit-D Deficiency, Fractures)    Fonda Levins, MD [facility] 10/16/2023, 2:39 PM

## 2023-10-17 LAB — LIPID PANEL
Chol/HDL Ratio: 1.9 {ratio} (ref 0.0–4.4)
Cholesterol, Total: 133 mg/dL (ref 100–199)
HDL: 70 mg/dL (ref >39)
LDL Chol Calc (NIH): 47 mg/dL (ref 0–99)
Triglycerides: 80 mg/dL (ref 0–149)
VLDL Cholesterol Cal: 16 mg/dL (ref 5–40)

## 2023-10-17 LAB — CMP14+EGFR
ALT: 19 [IU]/L (ref 0–32)
AST: 21 [IU]/L (ref 0–40)
Albumin: 4.4 g/dL (ref 3.9–4.9)
Alkaline Phosphatase: 58 [IU]/L (ref 44–121)
BUN/Creatinine Ratio: 21 (ref 12–28)
BUN: 15 mg/dL (ref 8–27)
Bilirubin Total: 1 mg/dL (ref 0.0–1.2)
CO2: 26 mmol/L (ref 20–29)
Calcium: 9.8 mg/dL (ref 8.7–10.3)
Chloride: 103 mmol/L (ref 96–106)
Creatinine, Ser: 0.73 mg/dL (ref 0.57–1.00)
Globulin, Total: 1.6 g/dL (ref 1.5–4.5)
Glucose: 100 mg/dL — ABNORMAL HIGH (ref 70–99)
Potassium: 4.1 mmol/L (ref 3.5–5.2)
Sodium: 142 mmol/L (ref 134–144)
Total Protein: 6 g/dL (ref 6.0–8.5)
eGFR: 88 mL/min/{1.73_m2} (ref >59)

## 2023-10-17 LAB — CBC WITH DIFFERENTIAL/PLATELET
Basophils Absolute: 0 10*3/uL (ref 0.0–0.2)
Basos: 1 %
EOS (ABSOLUTE): 0.3 10*3/uL (ref 0.0–0.4)
Eos: 4 %
Hematocrit: 40.5 % (ref 34.0–46.6)
Hemoglobin: 13.1 g/dL (ref 11.1–15.9)
Immature Grans (Abs): 0 10*3/uL (ref 0.0–0.1)
Immature Granulocytes: 0 %
Lymphocytes Absolute: 1.6 10*3/uL (ref 0.7–3.1)
Lymphs: 25 %
MCH: 30.2 pg (ref 26.6–33.0)
MCHC: 32.3 g/dL (ref 31.5–35.7)
MCV: 93 fL (ref 79–97)
Monocytes Absolute: 0.6 10*3/uL (ref 0.1–0.9)
Monocytes: 9 %
Neutrophils Absolute: 3.9 10*3/uL (ref 1.4–7.0)
Neutrophils: 61 %
Platelets: 260 10*3/uL (ref 150–450)
RBC: 4.34 x10E6/uL (ref 3.77–5.28)
RDW: 11.9 % (ref 11.7–15.4)
WBC: 6.4 10*3/uL (ref 3.4–10.8)

## 2023-10-17 LAB — VITAMIN D 25 HYDROXY (VIT D DEFICIENCY, FRACTURES): Vit D, 25-Hydroxy: 104 ng/mL — ABNORMAL HIGH (ref 30.0–100.0)

## 2023-10-21 ENCOUNTER — Ambulatory Visit (INDEPENDENT_AMBULATORY_CARE_PROVIDER_SITE_OTHER): Payer: Medicare Other

## 2023-10-21 ENCOUNTER — Other Ambulatory Visit: Payer: Self-pay | Admitting: Family Medicine

## 2023-10-21 DIAGNOSIS — Z780 Asymptomatic menopausal state: Secondary | ICD-10-CM

## 2023-10-22 DIAGNOSIS — Z780 Asymptomatic menopausal state: Secondary | ICD-10-CM | POA: Diagnosis not present

## 2023-10-22 DIAGNOSIS — M8589 Other specified disorders of bone density and structure, multiple sites: Secondary | ICD-10-CM | POA: Diagnosis not present

## 2024-01-12 ENCOUNTER — Other Ambulatory Visit: Payer: Self-pay

## 2024-03-17 DIAGNOSIS — L821 Other seborrheic keratosis: Secondary | ICD-10-CM | POA: Diagnosis not present

## 2024-03-17 DIAGNOSIS — D225 Melanocytic nevi of trunk: Secondary | ICD-10-CM | POA: Diagnosis not present

## 2024-03-17 DIAGNOSIS — L814 Other melanin hyperpigmentation: Secondary | ICD-10-CM | POA: Diagnosis not present

## 2024-03-17 DIAGNOSIS — D485 Neoplasm of uncertain behavior of skin: Secondary | ICD-10-CM | POA: Diagnosis not present

## 2024-04-08 ENCOUNTER — Ambulatory Visit (INDEPENDENT_AMBULATORY_CARE_PROVIDER_SITE_OTHER): Payer: Medicare Other

## 2024-04-08 VITALS — BP 131/64 | HR 90 | Ht 67.0 in | Wt 122.0 lb

## 2024-04-08 DIAGNOSIS — Z0000 Encounter for general adult medical examination without abnormal findings: Secondary | ICD-10-CM | POA: Diagnosis not present

## 2024-04-08 NOTE — Patient Instructions (Signed)
 Ms. Caroline Hodge , Thank you for taking time out of your busy schedule to complete your Annual Wellness Visit with me. I enjoyed our conversation and look forward to speaking with you again next year. I, as well as your care team,  appreciate your ongoing commitment to your health goals. Please review the following plan we discussed and let me know if I can assist you in the future. Your Game plan/ To Do List    Follow up Visits: Next Medicare AWV with our clinical staff: 03/2925 at 10:40   Next Office Visit with your provider: 10/18/24 at 10:40a.m.  Clinician Recommendations:  Aim for 30 minutes of exercise or brisk walking, 6-8 glasses of water, and 5 servings of fruits and vegetables each day.       This is a list of the screening recommended for you and due dates:  Health Maintenance  Topic Date Due   COVID-19 Vaccine (8 - 2024-25 season) 04/24/2025*   Flu Shot  05/14/2024   Medicare Annual Wellness Visit  04/08/2025   Mammogram  05/18/2025   DEXA scan (bone density measurement)  10/21/2025   Colon Cancer Screening  12/24/2025   DTaP/Tdap/Td vaccine (3 - Td or Tdap) 10/15/2033   Pneumococcal Vaccine for age over 64  Completed   Hepatitis C Screening  Completed   Zoster (Shingles) Vaccine  Completed   Hepatitis B Vaccine  Aged Out   HPV Vaccine  Aged Out   Meningitis B Vaccine  Aged Out   Stool Blood Test  Discontinued  *Topic was postponed. The date shown is not the original due date.    Advanced directives: (Copy Requested) Please bring a copy of your health care power of attorney and living will to the office to be added to your chart at your convenience. You can mail to [facility] [address] [place], [place] [address] or email to [email or web address] Advance Care Planning is important because it:  [x]  Makes sure you receive the medical care that is consistent with your values, goals, and preferences  [x]  It provides guidance to your family and loved  ones and reduces their decisional burden about whether or not they are making the right decisions based on your wishes.  Follow the link provided in your after visit summary or read over the paperwork we have mailed to you to help you started getting your Advance Directives in place. If you need assistance in completing these, please reach out to us  so that we can help you!  See attachments for Preventive Care and Fall Prevention Tips.

## 2024-04-08 NOTE — Progress Notes (Addendum)
 Subjective:   Caroline Hodge is a 73 y.o. who presents for a Medicare Wellness preventive visit.  As a reminder, Annual Wellness Visits don't include a physical exam, and some assessments may be limited, especially if this visit is performed virtually. We may recommend an in-person follow-up visit with your provider if needed.  Visit Complete: Virtual I connected with  Caroline Hodge on 04/08/24 by a audio enabled telemedicine application and verified that I am speaking with the correct person using two identifiers.  Patient Location: Home  Provider Location: Home Office  I discussed the limitations of evaluation and management by telemedicine. The patient expressed understanding and agreed to proceed.  Vital Signs: Because this visit was a virtual/telehealth visit, some criteria may be missing or patient reported. Any vitals not documented were not able to be obtained and vitals that have been documented are patient reported.  VideoDeclined- This patient declined Librarian, academic. Therefore the visit was completed with audio only.  Persons Participating in Visit: Patient.  AWV Questionnaire: No: Patient Medicare AWV questionnaire was not completed prior to this visit.  Cardiac Risk Factors include: advanced age (>53men, >59 women);dyslipidemia     Objective:    Today's Vitals   04/08/24 1313  BP: 131/64  Pulse: 90  Weight: 122 lb (55.3 kg)  Height: 5' 7 (1.702 m)   Body mass index is 19.11 kg/m.     04/08/2024    1:20 PM 04/08/2023    2:48 PM 04/05/2022    2:49 PM 04/04/2021    3:09 PM 12/02/2017   10:51 AM 11/26/2016   10:25 AM 12/06/2015    2:25 PM  Advanced Directives  Does Patient Have a Medical Advance Directive? Yes Yes Yes No Yes  Yes  Yes   Type of Estate agent of [place];Living will Healthcare Power of [place];Living will Healthcare Power of [place];Living will  Healthcare Power of [place];Living will  Healthcare Power of [place];Living will Living will;Healthcare Power of Attorney   Copy of Healthcare Power of Attorney in Chart? No - copy requested No - copy requested No - copy requested   No - copy requested    Would patient like information on creating a medical advance directive?    No - Patient declined        Data saved with a previous flowsheet row definition    Current Medications (verified) Outpatient Encounter Medications as of 04/08/2024  Medication Sig   aspirin  EC 81 MG tablet Take 1 tablet (81 mg total) by mouth daily.   Coral Calcium  1000 (390 Ca) MG TABS Take 1 tablet by mouth daily.   OVER THE COUNTER MEDICATION Multi-vitamin   rosuvastatin  (CRESTOR ) 5 MG tablet Take 1 tablet (5 mg total) by mouth daily.   VITAMIN D  PO Take 1,000 mg by mouth daily.   BOOSTRIX 5-2.5-18.5 LF-MCG/0.5 injection    No facility-administered encounter medications on file as of 04/08/2024.    Allergies (verified) Patient has no known allergies.   History: Past Medical History:  Diagnosis Date   Carotid artery occlusion    Osteopenia    Personal history of kidney stones 2007   Vitamin D  deficiency    Past Surgical History:  Procedure Laterality Date   CATARACT EXTRACTION W/PHACO Right 04/25/2014   Procedure: CATARACT EXTRACTION PHACO AND INTRAOCULAR LENS PLACEMENT (IOC);  Surgeon: Cherene Mania, MD;  Location: [facility];  Service: Ophthalmology;  Laterality: Right;  CDE 8.46   CATARACT EXTRACTION W/PHACO Left 05/09/2014  Procedure: CATARACT EXTRACTION PHACO AND INTRAOCULAR LENS PLACEMENT LEFT EYE CDE=6.23;  Surgeon: Cherene Mania, MD;  Location: [facility];  Service: Ophthalmology;  Laterality: Left;   COLONOSCOPY  2006   SKIN GRAFT Left 1975   foot; after foot injury   Family History  Problem Relation Age of Onset   Healthy Mother    Healthy Father    Colon cancer Neg Hx    Social History   Socioeconomic History   Marital status: Single    Spouse name: Not on file   Number of children:  0   Years of education: Not on file   Highest education level: Not on file  Occupational History   Occupation: retired  Tobacco Use   Smoking status: Never    Passive exposure: Never   Smokeless tobacco: Never  Vaping Use   Vaping status: Never Used  Substance and Sexual Activity   Alcohol use: Yes    Alcohol/week: 2.0 standard drinks of alcohol    Types: 1 Glasses of wine, 1 Standard drinks or equivalent per week   Drug use: No   Sexual activity: Yes  Other Topics Concern   Not on file  Social History Narrative   Lives alone.   Social Drivers of Corporate investment banker Strain: Low Risk  (04/08/2024)   Overall Financial Resource Strain (CARDIA)    Difficulty of Paying Living Expenses: Not hard at all  Food Insecurity: No Food Insecurity (04/08/2024)   Hunger Vital Sign    Worried About Running Out of Food in the Last Year: Never true    Ran Out of Food in the Last Year: Never true  Transportation Needs: No Transportation Needs (04/08/2024)   PRAPARE - Administrator, Civil Service (Medical): No    Lack of Transportation (Non-Medical): No  Physical Activity: Sufficiently Active (04/08/2024)   Exercise Vital Sign    Days of Exercise per Week: 7 days    Minutes of Exercise per Session: 30 min  Stress: No Stress Concern Present (04/08/2024)   [name] of Occupational Health - Occupational Stress Questionnaire    Feeling of Stress: Not at all  Social Connections: Moderately Integrated (04/08/2024)   Social Connection and Isolation Panel    Frequency of Communication with Friends and Family: More than three times a week    Frequency of Social Gatherings with Friends and Family: More than three times a week    Attends Religious Services: More than 4 times per year    Active Member of [employer] or Organizations: Yes    Attends Engineer, structural: More than 4 times per year    Marital Status: Never married    Tobacco Counseling Counseling given:  Yes    Clinical Intake:  Pre-visit preparation completed: Yes  Pain : No/denies pain     BMI - recorded: 19.11 Nutritional Status: BMI of 19-24  Normal Nutritional Risks: None Diabetes: No  No results found for: HGBA1C   How often do you need to have someone help you when you read instructions, pamphlets, or other written materials from your doctor or pharmacy?: 1 - Never  Interpreter Needed?: No  Information entered by :: Alia T/cma   Activities of Daily Living     04/08/2024    1:20 PM  In your present state of health, do you have any difficulty performing the following activities:  Hearing? 0  Vision? 0  Difficulty concentrating or making decisions? 0  Walking or climbing stairs? 0  Dressing or bathing? 0  Doing errands, shopping? 0  Preparing Food and eating ? N  Using the Toilet? N  In the past six months, have you accidently leaked urine? N  Do you have problems with loss of bowel control? N  Managing your Medications? N  Managing your Finances? N  Housekeeping or managing your Housekeeping? N    Patient Care Team: Dettinger, Fonda LABOR, MD as PCP - General (Family Medicine) Vicci Mcardle, OD (Optometry) Early, Krystal FALCON, MD (Inactive) as Consulting Physician (Vascular Surgery)  I have updated your Care Teams any recent Medical Services you may have received from other providers in the past year.     Assessment:   This is a routine wellness examination for Caroline Hodge.  Hearing/Vision screen Hearing Screening - Comments:: Pt denies hearing dif Vision Screening - Comments:: Pt denies vision dif/pt goes [facility] In [place], [place]/last sept 2024/ have upcoming apt in 9/25   Goals Addressed             This Visit's Progress    Patient Stated   On track    Just wants to stay healthy and independent       Depression Screen     04/08/2024    1:23 PM 04/08/2023    2:47 PM 04/05/2022    2:48 PM 12/17/2021   11:36 AM 09/10/2021   10:02 AM 04/04/2021     3:03 PM 09/06/2020   10:36 AM  PHQ 2/9 Scores  PHQ - 2 Score 0 0 0 0 0 0 0  PHQ- 9 Score 0   0       Fall Risk     04/08/2024    1:17 PM 10/16/2023    2:35 PM 04/08/2023    2:45 PM 04/05/2022    2:47 PM 12/17/2021   11:36 AM  Fall Risk   Falls in the past year? 0 0 0 0 0  Number falls in past yr: 0  0 0   Injury with Fall? 0  0 0   Risk for fall due to : No Fall Risks  No Fall Risks No Fall Risks   Follow up Falls evaluation completed  Falls prevention discussed Falls prevention discussed       Data saved with a previous flowsheet row definition    MEDICARE RISK AT HOME:  Medicare Risk at Home Any stairs in or around the home?: Yes If so, are there any without handrails?: Yes Home free of loose throw rugs in walkways, pet beds, electrical cords, etc?: Yes Adequate lighting in your home to reduce risk of falls?: Yes Life alert?: No Use of a cane, walker or w/c?: No Grab bars in the bathroom?: No Shower chair or bench in shower?: Yes Elevated toilet seat or a handicapped toilet?: Yes  TIMED UP AND GO:  Was the test performed?  no  Cognitive Function: 6CIT completed        04/08/2024    1:24 PM 04/08/2023    2:48 PM 04/05/2022    2:49 PM 04/04/2021    3:04 PM  6CIT Screen  What Year? 0 points 0 points 0 points 0 points  What month? 0 points 0 points 0 points 0 points  What time? 0 points 0 points 0 points 0 points  Count back from 20 0 points 0 points 0 points 0 points  Months in reverse 0 points 0 points 0 points 0 points  Repeat phrase 0 points 0 points 0 points 0 points  Total Score 0 points 0 points 0 points 0 points    Immunizations Immunization History  Administered Date(s) Administered   Fluad Quad(high Dose 65+) 07/26/2019, 07/11/2022   Influenza Whole 07/14/2012   Influenza, High Dose Seasonal PF 08/12/2018   Influenza,inj,Quad PF,6+ Mos 07/21/2013, 07/29/2014, 08/28/2015, 08/06/2016, 07/28/2017   Influenza-Unspecified 08/02/2020, 07/10/2021   Moderna  Covid-19 Vaccine Bivalent Booster 56yrs & up 07/31/2021, 03/05/2022   Moderna Sars-Covid-2 Vaccination 03/14/2020, 04/12/2020, 08/09/2020, 02/06/2021   Pfizer(Comirnaty)Fall Seasonal Vaccine 12 years and older 07/23/2022   Pneumococcal Conjugate-13 08/10/2013   Pneumococcal Polysaccharide-23 09/02/2018   Respiratory Syncytial Virus Vaccine,Recomb Aduvanted(Arexvy) 07/11/2022   Tdap 06/15/2011, 10/16/2023   Zoster Recombinant(Shingrix) 01/01/2022, 04/02/2022   Zoster, Live 11/24/2013    Screening Tests Health Maintenance  Topic Date Due   COVID-19 Vaccine (8 - 2024-25 season) 04/24/2025 (Originally 06/15/2023)   INFLUENZA VACCINE  05/14/2024   Medicare Annual Wellness (AWV)  04/08/2025   MAMMOGRAM  05/18/2025   DEXA SCAN  10/21/2025   Colonoscopy  12/24/2025   DTaP/Tdap/Td (3 - Td or Tdap) 10/15/2033   Pneumococcal Vaccine: 50+ Years  Completed   Hepatitis C Screening  Completed   Zoster Vaccines- Shingrix  Completed   Hepatitis B Vaccines  Aged Out   HPV VACCINES  Aged Out   Meningococcal B Vaccine  Aged Out   COLON CANCER SCREENING ANNUAL FOBT  Discontinued    Health Maintenance  There are no preventive care reminders to display for this patient.  Health Maintenance Items Addressed: See Nurse Notes at the end of this note  Additional Screening:  Vision Screening: Recommended annual ophthalmology exams for early detection of glaucoma and other disorders of the eye. Would you like a referral to an eye doctor? No    Dental Screening: Recommended annual dental exams for proper oral hygiene  Community Resource Referral / Chronic Care Management: CRR required this visit?  No   CCM required this visit?  No   Plan:    I have personally reviewed and noted the following in the patient's chart:   Medical and social history Use of alcohol, tobacco or illicit drugs  Current medications and supplements including opioid prescriptions. Patient is not currently taking opioid  prescriptions. Functional ability and status Nutritional status Physical activity Advanced directives List of other physicians Hospitalizations, surgeries, and ER visits in previous 12 months Vitals Screenings to include cognitive, depression, and falls Referrals and appointments  In addition, I have reviewed and discussed with patient certain preventive protocols, quality metrics, and best practice recommendations. A written personalized care plan for preventive services as well as general preventive health recommendations were provided to patient.   Ozie Ned, CMA   04/08/2024   After Visit Summary: (MyChart) Due to this being a telephonic visit, the after visit summary with patients personalized plan was offered to patient via MyChart     I have reviewed and agree with the above AWV documentation.   Mary-Margaret Gladis, FNP   Notes: Nothing significant to report at this time.

## 2024-04-13 DIAGNOSIS — D485 Neoplasm of uncertain behavior of skin: Secondary | ICD-10-CM | POA: Diagnosis not present

## 2024-04-13 DIAGNOSIS — C44719 Basal cell carcinoma of skin of left lower limb, including hip: Secondary | ICD-10-CM | POA: Diagnosis not present

## 2024-05-11 DIAGNOSIS — C44719 Basal cell carcinoma of skin of left lower limb, including hip: Secondary | ICD-10-CM | POA: Diagnosis not present

## 2024-06-21 ENCOUNTER — Ambulatory Visit
Admission: RE | Admit: 2024-06-21 | Discharge: 2024-06-21 | Disposition: A | Discharge status: Home / Self Care | Source: Ambulatory Visit | Attending: Family Medicine | Admitting: Family Medicine

## 2024-06-21 ENCOUNTER — Other Ambulatory Visit: Payer: Self-pay | Admitting: Family Medicine

## 2024-06-21 DIAGNOSIS — Z1231 Encounter for screening mammogram for malignant neoplasm of breast: Secondary | ICD-10-CM | POA: Diagnosis not present

## 2024-07-26 DIAGNOSIS — Z23 Encounter for immunization: Secondary | ICD-10-CM | POA: Diagnosis not present

## 2024-07-29 ENCOUNTER — Other Ambulatory Visit: Payer: Self-pay

## 2024-07-29 DIAGNOSIS — I6523 Occlusion and stenosis of bilateral carotid arteries: Secondary | ICD-10-CM

## 2024-08-03 DIAGNOSIS — D2262 Melanocytic nevi of left upper limb, including shoulder: Secondary | ICD-10-CM | POA: Diagnosis not present

## 2024-08-03 DIAGNOSIS — D2261 Melanocytic nevi of right upper limb, including shoulder: Secondary | ICD-10-CM | POA: Diagnosis not present

## 2024-08-03 DIAGNOSIS — L821 Other seborrheic keratosis: Secondary | ICD-10-CM | POA: Diagnosis not present

## 2024-08-03 DIAGNOSIS — L814 Other melanin hyperpigmentation: Secondary | ICD-10-CM | POA: Diagnosis not present

## 2024-08-03 DIAGNOSIS — Z85828 Personal history of other malignant neoplasm of skin: Secondary | ICD-10-CM | POA: Diagnosis not present

## 2024-08-03 DIAGNOSIS — Z08 Encounter for follow-up examination after completed treatment for malignant neoplasm: Secondary | ICD-10-CM | POA: Diagnosis not present

## 2024-08-23 DIAGNOSIS — Z23 Encounter for immunization: Secondary | ICD-10-CM | POA: Diagnosis not present

## 2024-08-24 ENCOUNTER — Ambulatory Visit (HOSPITAL_COMMUNITY)
Admission: RE | Admit: 2024-08-24 | Discharge: 2024-08-24 | Disposition: A | Discharge status: Home / Self Care | Source: Ambulatory Visit | Attending: Surgery | Admitting: Surgery

## 2024-08-24 ENCOUNTER — Ambulatory Visit (INDEPENDENT_AMBULATORY_CARE_PROVIDER_SITE_OTHER): Admitting: Vascular Surgery

## 2024-08-24 ENCOUNTER — Encounter: Payer: Self-pay | Admitting: Vascular Surgery

## 2024-08-24 VITALS — BP 138/81 | HR 84 | Temp 98.0°F | Ht 67.0 in | Wt 119.0 lb

## 2024-08-24 DIAGNOSIS — I6523 Occlusion and stenosis of bilateral carotid arteries: Secondary | ICD-10-CM | POA: Insufficient documentation

## 2024-08-24 NOTE — Progress Notes (Signed)
 [facility] [place]  Patient name: Caroline Hodge MRN: [id number] DOB: May 23, 1951 Sex: female  REASON FOR VISIT: Continued follow-up bilateral carotid stenosis  HPI: Caroline Hodge is a 73 y.o. female here today for follow-up.  Had discovery of carotid stenosis and evaluation of right carotid bruit.  She has no prior history of transient ischemic, aphasia, amaurosis fugax or stroke.  She remains otherwise healthy.  Has never smoked cigarettes.  07/15/23: Patient returns for follow-up of asymptomatic carotid artery stenosis.  The patient reports no neurologic events since last evaluation by Dr. Oris.  She has no known risk factors for atherosclerotic disease.  We reviewed her duplex in detail.  08/24/24: Returns clinic for asymptomatic carotid artery stenosis surveillance.  She is doing great.  She has no complaints.  She is very active and spry.  She appears much younger than her stated age.  No stroke or TIA since I saw her last.  Past Medical History:  Diagnosis Date   Carotid artery occlusion    Osteopenia    Personal history of kidney stones 2007   Vitamin D  deficiency     Family History  Problem Relation Age of Onset   Healthy Mother    Healthy Father    Colon cancer Neg Hx     SOCIAL HISTORY: Social History   Tobacco Use   Smoking status: Never    Passive exposure: Never   Smokeless tobacco: Never  Substance Use Topics   Alcohol use: Yes    Alcohol/week: 2.0 standard drinks of alcohol    Types: 1 Glasses of wine, 1 Standard drinks or equivalent per week    No Known Allergies  Current Outpatient Medications  Medication Sig Dispense Refill   aspirin  EC 81 MG tablet Take 1 tablet (81 mg total) by mouth daily. 100 tablet 11   Coral Calcium  1000 (390 Ca) MG TABS Take 1 tablet by mouth daily.     OVER THE COUNTER MEDICATION Multi-vitamin     rosuvastatin  (CRESTOR ) 5 MG tablet Take 1 tablet (5 mg total) by  mouth daily. 90 tablet 4   VITAMIN D  PO Take 1,000 mg by mouth daily.     No current facility-administered medications for this visit.    PHYSICAL EXAM: Vitals:   08/24/24 1421 08/24/24 1423  BP: 136/77 138/81  Pulse: 84   Temp: 98 F (36.7 C)   SpO2: 100%   Weight: 119 lb (54 kg)   Height: 5' 7 (1.702 m)      GENERAL: The patient is a well-nourished female, in no acute distress. The vital signs are documented above. CARDIOVASCULAR: She does have a soft right carotid bruit and no bruit on the left.  2+ radial pulses bilaterally PULMONARY: There is good air exchange  MUSCULOSKELETAL: There are no major deformities or cyanosis. NEUROLOGIC: No focal weakness or paresthesias are detected. SKIN: There are no ulcers or rashes noted. PSYCHIATRIC: The patient has a normal affect.  DATA:  Right Carotid: Velocities in the right ICA are consistent with a 40-59%                 stenosis.   Left Carotid: Velocities in the left ICA are consistent with a 40-59%  stenosis.   Vertebrals: Bilateral vertebral arteries demonstrate antegrade flow.  Subclavians: Normal flow hemodynamics were seen in bilateral subclavian               arteries.    MEDICAL ISSUES: Caroline Hodge is a  73 y.o. female with asymptomatic bilateral 40-59% carotid artery stenosis.   Recommend:  Abstinence from all tobacco products. Blood glucose control with goal A1c < 7%. Blood pressure control with goal blood pressure < 140/90 mmHg. Lipid reduction therapy with goal LDL-C <100 mg/dL  Aspirin  81mg  PO QD.  Atorvastatin 40-80mg  PO QD (or other high intensity statin therapy).  Follow up with me in 1 year with carotid duplex  Caroline Hodge. Magda, MD [facility] Phone Number: [phone] 08/24/2024 2:39 PM

## 2024-10-18 ENCOUNTER — Encounter: Payer: Self-pay | Admitting: Family Medicine

## 2024-10-18 ENCOUNTER — Ambulatory Visit: Payer: BLUE CROSS/BLUE SHIELD | Admitting: Family Medicine

## 2024-10-18 VITALS — BP 137/80 | HR 93 | Ht 67.0 in | Wt 119.0 lb

## 2024-10-18 DIAGNOSIS — E785 Hyperlipidemia, unspecified: Secondary | ICD-10-CM

## 2024-10-18 DIAGNOSIS — E559 Vitamin D deficiency, unspecified: Secondary | ICD-10-CM | POA: Diagnosis not present

## 2024-10-18 DIAGNOSIS — Z0000 Encounter for general adult medical examination without abnormal findings: Secondary | ICD-10-CM

## 2024-10-18 DIAGNOSIS — M8580 Other specified disorders of bone density and structure, unspecified site: Secondary | ICD-10-CM | POA: Diagnosis not present

## 2024-10-18 DIAGNOSIS — I779 Disorder of arteries and arterioles, unspecified: Secondary | ICD-10-CM | POA: Diagnosis not present

## 2024-10-18 MED ORDER — ROSUVASTATIN CALCIUM 5 MG PO TABS: 5.0000 mg | ORAL_TABLET | Freq: Every day | ORAL | 3 refills | Status: AC

## 2024-10-18 NOTE — Progress Notes (Signed)
 "  BP 137/80   Pulse 93   Ht 5' 7 (1.702 m)   Wt 119 lb (54 kg)   SpO2 100%   BMI 18.64 kg/m    Subjective:   Patient ID: Caroline Hodge, female    DOB: Oct 04, 1951, 74 y.o.   MRN: [id number]  HPI: Caroline Hodge is a 73 y.o. female presenting on 10/18/2024 for Medical Management of Chronic Issues (CPE)   Discussed the use of AI scribe software for clinical note transcription with the patient, who gave verbal consent to proceed.  History of Present Illness   Caroline Hodge is a 74 year old female who presents for an annual physical exam.  General health status - No current health concerns - Describes overall well-being as 'doing pretty good' and 'keeping active'  Breast health - No breast lumps or abnormalities - Adheres to routine mammogram screening  Cardiovascular status - Blood pressure measured at 137/80 mmHg, attributed to rushing to the appointment - No history of hypertension  Gastrointestinal and genitourinary symptoms - No bowel issues - No urinary issues  Immunization status and infectious exposure - Received influenza, COVID, and RSV vaccinations - During the holidays, household exposure to influenza with three family members experiencing high fevers and three without fever; no illness in patient        Hyperlipidemia Patient is coming in for recheck of his hyperlipidemia. The patient is currently taking Crestor . They deny any issues with myalgias or history of liver damage from it. They deny any focal numbness or weakness or chest pain.   Relevant past medical, surgical, family and social history reviewed and updated as indicated. Interim medical history since our last visit reviewed. Allergies and medications reviewed and updated.  Review of Systems  Constitutional:  Negative for chills and fever.  Eyes:  Negative for visual disturbance.  Respiratory:  Negative for chest tightness and shortness of breath.   Cardiovascular:  Negative for chest pain and  leg swelling.  Genitourinary:  Negative for difficulty urinating and dysuria.  Musculoskeletal:  Negative for back pain and gait problem.  Skin:  Negative for rash.  Neurological:  Negative for dizziness, light-headedness and headaches.  Psychiatric/Behavioral:  Negative for agitation and behavioral problems.   All other systems reviewed and are negative.   Per HPI unless specifically indicated above   Allergies as of 10/18/2024   No Known Allergies      Medication List        Accurate as of October 18, 2024 11:05 AM. If you have any questions, ask your nurse or doctor.          aspirin  EC 81 MG tablet Take 1 tablet (81 mg total) by mouth daily.   Coral Calcium  1000 (390 Ca) MG Tabs Take 1 tablet by mouth daily.   OVER THE COUNTER MEDICATION Multi-vitamin   rosuvastatin  5 MG tablet Commonly known as: CRESTOR  Take 1 tablet (5 mg total) by mouth daily.   VITAMIN D  PO Take 1,000 mg by mouth daily.         Objective:   BP 137/80   Pulse 93   Ht 5' 7 (1.702 m)   Wt 119 lb (54 kg)   SpO2 100%   BMI 18.64 kg/m   Wt Readings from Last 3 Encounters:  10/18/24 119 lb (54 kg)  08/24/24 119 lb (54 kg)  04/08/24 122 lb (55.3 kg)    Physical Exam Vitals and nursing note reviewed.  Constitutional:      Appearance:  Normal appearance.  Abdominal:     General: Abdomen is flat. Bowel sounds are normal. There is no distension.     Palpations: Abdomen is soft.     Tenderness: There is no abdominal tenderness. There is no guarding or rebound.     Hernia: No hernia is present.  Neurological:     Mental Status: She is alert.    Physical Exam   VITALS: BP- 137/80 HEENT: Throat normal. CHEST: Lungs clear to auscultation. CARDIOVASCULAR: Heart regular rate and rhythm, no murmurs. EXTREMITIES: No swelling in extremities.         Assessment & Plan:   Problem List Items Addressed This Visit       Cardiovascular and Mediastinum   Carotid artery disease    Relevant Medications   rosuvastatin  (CRESTOR ) 5 MG tablet     Musculoskeletal and Integument   Osteopenia     Other   Dyslipidemia - Primary   Relevant Medications   rosuvastatin  (CRESTOR ) 5 MG tablet   Other Relevant Orders   CBC with Differential/Platelet   CMP14+EGFR   Lipid panel   VITAMIN D  25 Hydroxy (Vit-D Deficiency, Fractures)   Vitamin D  deficiency   Relevant Orders   VITAMIN D  25 Hydroxy (Vit-D Deficiency, Fractures)   Other Visit Diagnoses       Physical exam       Relevant Orders   CBC with Differential/Platelet   CMP14+EGFR   Lipid panel   VITAMIN D  25 Hydroxy (Vit-D Deficiency, Fractures)         Adult Wellness Visit Annual wellness visit with no acute health concerns. Blood pressure elevated at 137/80, likely due to recent activity. Recent flu outbreak in household, but no severe symptoms due to vaccinations. - Continue regular mammograms. - Monitor blood pressure periodically. - Maintain active lifestyle.          Follow up plan: Return in about 1 year (around 10/18/2025), or if symptoms worsen or fail to improve, for Physical and cholesterol recheck.  Counseling provided for all of the vaccine components Orders Placed This Encounter  Procedures   CBC with Differential/Platelet   CMP14+EGFR   Lipid panel   VITAMIN D  25 Hydroxy (Vit-D Deficiency, Fractures)    Fonda Levins, MD [facility] 10/18/2024, 11:05 AM     "

## 2024-10-19 LAB — CMP14+EGFR
ALT: 18 IU/L (ref 0–32)
AST: 22 IU/L (ref 0–40)
Albumin: 4.5 g/dL (ref 3.8–4.8)
Alkaline Phosphatase: 62 IU/L (ref 49–135)
BUN/Creatinine Ratio: 34 — ABNORMAL HIGH (ref 12–28)
BUN: 21 mg/dL (ref 8–27)
Bilirubin Total: 0.8 mg/dL (ref 0.0–1.2)
CO2: 26 mmol/L (ref 20–29)
Calcium: 10 mg/dL (ref 8.7–10.3)
Chloride: 104 mmol/L (ref 96–106)
Creatinine, Ser: 0.62 mg/dL (ref 0.57–1.00)
Globulin, Total: 2 g/dL (ref 1.5–4.5)
Glucose: 80 mg/dL (ref 70–99)
Potassium: 4.2 mmol/L (ref 3.5–5.2)
Sodium: 142 mmol/L (ref 134–144)
Total Protein: 6.5 g/dL (ref 6.0–8.5)
eGFR: 95 mL/min/1.73

## 2024-10-19 LAB — LIPID PANEL
Chol/HDL Ratio: 2.1 ratio (ref 0.0–4.4)
Cholesterol, Total: 144 mg/dL (ref 100–199)
HDL: 68 mg/dL
LDL Chol Calc (NIH): 63 mg/dL (ref 0–99)
Triglycerides: 63 mg/dL (ref 0–149)
VLDL Cholesterol Cal: 13 mg/dL (ref 5–40)

## 2024-10-19 LAB — CBC WITH DIFFERENTIAL/PLATELET
Basophils Absolute: 0 x10E3/uL (ref 0.0–0.2)
Basos: 1 %
EOS (ABSOLUTE): 0.2 x10E3/uL (ref 0.0–0.4)
Eos: 4 %
Hematocrit: 40.2 % (ref 34.0–46.6)
Hemoglobin: 13 g/dL (ref 11.1–15.9)
Immature Grans (Abs): 0 x10E3/uL (ref 0.0–0.1)
Immature Granulocytes: 0 %
Lymphocytes Absolute: 1.6 x10E3/uL (ref 0.7–3.1)
Lymphs: 26 %
MCH: 30 pg (ref 26.6–33.0)
MCHC: 32.3 g/dL (ref 31.5–35.7)
MCV: 93 fL (ref 79–97)
Monocytes Absolute: 0.5 x10E3/uL (ref 0.1–0.9)
Monocytes: 8 %
Neutrophils Absolute: 3.8 x10E3/uL (ref 1.4–7.0)
Neutrophils: 61 %
Platelets: 268 x10E3/uL (ref 150–450)
RBC: 4.34 x10E6/uL (ref 3.77–5.28)
RDW: 12.8 % (ref 11.7–15.4)
WBC: 6.2 x10E3/uL (ref 3.4–10.8)

## 2024-10-19 LAB — VITAMIN D 25 HYDROXY (VIT D DEFICIENCY, FRACTURES): Vit D, 25-Hydroxy: 91.6 ng/mL (ref 30.0–100.0)

## 2024-10-25 ENCOUNTER — Ambulatory Visit: Payer: Self-pay | Admitting: Family Medicine

## 2025-04-11 ENCOUNTER — Ambulatory Visit: Payer: Self-pay

## 2025-08-23 ENCOUNTER — Ambulatory Visit (HOSPITAL_COMMUNITY)

## 2025-08-23 ENCOUNTER — Ambulatory Visit: Admitting: Vascular Surgery

## 2025-10-19 ENCOUNTER — Encounter: Admitting: Family Medicine
